# Patient Record
Sex: Female | Born: 1960 | Race: White | Hispanic: No | Marital: Married | Smoking: Never smoker
Health system: Southern US, Community
[De-identification: ages and names within clinical notes are randomized; demographics above are authoritative.]

## PROBLEM LIST (undated history)

## (undated) DIAGNOSIS — F419 Anxiety disorder, unspecified: Secondary | ICD-10-CM

## (undated) DIAGNOSIS — I10 Essential (primary) hypertension: Secondary | ICD-10-CM

## (undated) DIAGNOSIS — E785 Hyperlipidemia, unspecified: Secondary | ICD-10-CM

## (undated) DIAGNOSIS — C439 Malignant melanoma of skin, unspecified: Secondary | ICD-10-CM

## (undated) DIAGNOSIS — K219 Gastro-esophageal reflux disease without esophagitis: Secondary | ICD-10-CM

## (undated) DIAGNOSIS — Z309 Encounter for contraceptive management, unspecified: Secondary | ICD-10-CM

## (undated) DIAGNOSIS — E7800 Pure hypercholesterolemia, unspecified: Secondary | ICD-10-CM

## (undated) DIAGNOSIS — R42 Dizziness and giddiness: Secondary | ICD-10-CM

## (undated) HISTORY — PX: OTHER SURGICAL HISTORY: SHX169

## (undated) HISTORY — DX: Gastro-esophageal reflux disease without esophagitis: K21.9

## (undated) HISTORY — DX: Hyperlipidemia, unspecified: E78.5

## (undated) HISTORY — DX: Pure hypercholesterolemia, unspecified: E78.00

## (undated) HISTORY — DX: Encounter for contraceptive management, unspecified: Z30.9

## (undated) HISTORY — DX: Malignant melanoma of skin, unspecified: C43.9

---

## 2001-12-16 ENCOUNTER — Other Ambulatory Visit: Admission: RE | Admit: 2001-12-16 | Discharge: 2001-12-16 | Payer: Self-pay | Admitting: Obstetrics and Gynecology

## 2004-03-07 ENCOUNTER — Ambulatory Visit (HOSPITAL_COMMUNITY): Admission: RE | Admit: 2004-03-07 | Discharge: 2004-03-07 | Payer: Self-pay | Admitting: Obstetrics and Gynecology

## 2004-09-12 ENCOUNTER — Ambulatory Visit (HOSPITAL_COMMUNITY): Admission: RE | Admit: 2004-09-12 | Discharge: 2004-09-12 | Payer: Self-pay | Admitting: Obstetrics and Gynecology

## 2005-03-26 ENCOUNTER — Encounter: Admission: RE | Admit: 2005-03-26 | Discharge: 2005-03-26 | Payer: Self-pay | Admitting: Obstetrics and Gynecology

## 2006-05-22 ENCOUNTER — Encounter: Admission: RE | Admit: 2006-05-22 | Discharge: 2006-05-22 | Payer: Self-pay | Admitting: Obstetrics and Gynecology

## 2006-05-28 ENCOUNTER — Encounter: Admission: RE | Admit: 2006-05-28 | Discharge: 2006-05-28 | Payer: Self-pay | Admitting: Obstetrics and Gynecology

## 2007-07-17 ENCOUNTER — Encounter: Admission: RE | Admit: 2007-07-17 | Discharge: 2007-07-17 | Payer: Self-pay | Admitting: Obstetrics and Gynecology

## 2007-07-21 ENCOUNTER — Other Ambulatory Visit: Admission: RE | Admit: 2007-07-21 | Discharge: 2007-07-21 | Payer: Self-pay | Admitting: Obstetrics and Gynecology

## 2007-09-09 ENCOUNTER — Other Ambulatory Visit: Admission: RE | Admit: 2007-09-09 | Discharge: 2007-09-09 | Payer: Self-pay | Admitting: Obstetrics and Gynecology

## 2008-07-29 ENCOUNTER — Encounter: Admission: RE | Admit: 2008-07-29 | Discharge: 2008-07-29 | Payer: Self-pay | Admitting: Obstetrics and Gynecology

## 2008-08-12 ENCOUNTER — Encounter: Admission: RE | Admit: 2008-08-12 | Discharge: 2008-08-12 | Payer: Self-pay | Admitting: Obstetrics and Gynecology

## 2008-09-09 ENCOUNTER — Other Ambulatory Visit: Admission: RE | Admit: 2008-09-09 | Discharge: 2008-09-09 | Payer: Self-pay | Admitting: Obstetrics and Gynecology

## 2009-08-31 ENCOUNTER — Encounter: Admission: RE | Admit: 2009-08-31 | Discharge: 2009-08-31 | Payer: Self-pay | Admitting: Obstetrics and Gynecology

## 2009-09-07 ENCOUNTER — Other Ambulatory Visit: Admission: RE | Admit: 2009-09-07 | Discharge: 2009-09-07 | Payer: Self-pay | Admitting: Obstetrics and Gynecology

## 2010-09-04 ENCOUNTER — Ambulatory Visit (HOSPITAL_COMMUNITY)
Admission: RE | Admit: 2010-09-04 | Discharge: 2010-09-04 | Payer: Self-pay | Source: Home / Self Care | Admitting: Obstetrics and Gynecology

## 2010-10-29 ENCOUNTER — Other Ambulatory Visit: Payer: Self-pay | Admitting: Adult Health

## 2010-10-29 ENCOUNTER — Inpatient Hospital Stay (HOSPITAL_COMMUNITY): Admit: 2010-10-29 | Payer: Self-pay

## 2010-10-29 ENCOUNTER — Other Ambulatory Visit (HOSPITAL_COMMUNITY)
Admission: RE | Admit: 2010-10-29 | Discharge: 2010-10-29 | Disposition: A | Discharge status: Home / Self Care | Payer: BLUE CROSS/BLUE SHIELD | Source: Ambulatory Visit | Attending: Obstetrics and Gynecology | Admitting: Obstetrics and Gynecology

## 2010-10-29 DIAGNOSIS — Z01419 Encounter for gynecological examination (general) (routine) without abnormal findings: Secondary | ICD-10-CM | POA: Insufficient documentation

## 2011-10-29 ENCOUNTER — Other Ambulatory Visit: Payer: Self-pay | Admitting: Obstetrics and Gynecology

## 2011-10-29 DIAGNOSIS — Z139 Encounter for screening, unspecified: Secondary | ICD-10-CM

## 2011-11-04 ENCOUNTER — Ambulatory Visit (HOSPITAL_COMMUNITY)
Admission: RE | Admit: 2011-11-04 | Discharge: 2011-11-04 | Disposition: A | Discharge status: Home / Self Care | Payer: BC Managed Care – PPO | Source: Ambulatory Visit | Attending: Obstetrics and Gynecology | Admitting: Obstetrics and Gynecology

## 2011-11-04 DIAGNOSIS — Z139 Encounter for screening, unspecified: Secondary | ICD-10-CM

## 2011-11-04 DIAGNOSIS — Z1231 Encounter for screening mammogram for malignant neoplasm of breast: Secondary | ICD-10-CM | POA: Insufficient documentation

## 2011-11-06 ENCOUNTER — Other Ambulatory Visit: Payer: Self-pay | Admitting: Adult Health

## 2011-11-06 ENCOUNTER — Other Ambulatory Visit (HOSPITAL_COMMUNITY)
Admission: RE | Admit: 2011-11-06 | Discharge: 2011-11-06 | Disposition: A | Discharge status: Home / Self Care | Payer: BC Managed Care – PPO | Source: Ambulatory Visit | Attending: Obstetrics and Gynecology | Admitting: Obstetrics and Gynecology

## 2011-11-06 DIAGNOSIS — Z01419 Encounter for gynecological examination (general) (routine) without abnormal findings: Secondary | ICD-10-CM | POA: Insufficient documentation

## 2012-11-10 ENCOUNTER — Other Ambulatory Visit: Payer: Self-pay | Admitting: Adult Health

## 2012-11-10 ENCOUNTER — Other Ambulatory Visit (HOSPITAL_COMMUNITY)
Admission: RE | Admit: 2012-11-10 | Discharge: 2012-11-10 | Disposition: A | Discharge status: Home / Self Care | Payer: BC Managed Care – PPO | Source: Ambulatory Visit | Attending: Obstetrics and Gynecology | Admitting: Obstetrics and Gynecology

## 2012-11-10 DIAGNOSIS — Z01419 Encounter for gynecological examination (general) (routine) without abnormal findings: Secondary | ICD-10-CM | POA: Insufficient documentation

## 2012-11-10 DIAGNOSIS — Z1151 Encounter for screening for human papillomavirus (HPV): Secondary | ICD-10-CM | POA: Insufficient documentation

## 2013-01-27 ENCOUNTER — Telehealth: Payer: Self-pay | Admitting: Adult Health

## 2013-01-27 NOTE — Telephone Encounter (Signed)
Ovary ache, had period last,but better now. Keep calendar of events. And call prn, i told her could have been early ovulation.

## 2013-11-22 ENCOUNTER — Ambulatory Visit (INDEPENDENT_AMBULATORY_CARE_PROVIDER_SITE_OTHER): Payer: BC Managed Care – PPO | Admitting: Adult Health

## 2013-11-22 ENCOUNTER — Encounter: Payer: Self-pay | Admitting: Adult Health

## 2013-11-22 VITALS — BP 118/76 | HR 74 | Ht 63.0 in | Wt 171.0 lb

## 2013-11-22 DIAGNOSIS — Z01419 Encounter for gynecological examination (general) (routine) without abnormal findings: Secondary | ICD-10-CM

## 2013-11-22 DIAGNOSIS — Z309 Encounter for contraceptive management, unspecified: Secondary | ICD-10-CM

## 2013-11-22 DIAGNOSIS — Z1212 Encounter for screening for malignant neoplasm of rectum: Secondary | ICD-10-CM

## 2013-11-22 HISTORY — DX: Encounter for contraceptive management, unspecified: Z30.9

## 2013-11-22 LAB — HEMOCCULT GUIAC POC 1CARD (OFFICE): Fecal Occult Blood, POC: NEGATIVE

## 2013-11-22 MED ORDER — LEVONORG-ETH ESTRAD TRIPHASIC PO TABS: 1.0000 | ORAL_TABLET | Freq: Every day | ORAL | Status: DC

## 2013-11-22 NOTE — Patient Instructions (Signed)
Perimenopause Perimenopause is the time when your body begins to move into the menopause (no menstrual period for 12 straight months). It is a natural process. Perimenopause can begin 2 8 years before the menopause and usually lasts for 1 year after the menopause. During this time, your ovaries may or may not produce an egg. The ovaries vary in their production of estrogen and progesterone hormones each month. This can cause irregular menstrual periods, difficulty getting pregnant, vaginal bleeding between periods, and uncomfortable symptoms. CAUSES  Irregular production of the ovarian hormones, estrogen and progesterone, and not ovulating every month.  Other causes include:  Tumor of the pituitary gland in the brain.  Medical disease that affects the ovaries.  Radiation treatment.  Chemotherapy.  Unknown causes.  Heavy smoking and excessive alcohol intake can bring on perimenopause sooner. SIGNS AND SYMPTOMS   Hot flashes.  Night sweats.  Irregular menstrual periods.  Decreased sex drive.  Vaginal dryness.  Headaches.  Mood swings.  Depression.  Memory problems.  Irritability.  Tiredness.  Weight gain.  Trouble getting pregnant.  The beginning of losing bone cells (osteoporosis).  The beginning of hardening of the arteries (atherosclerosis). DIAGNOSIS  Your health care provider will make a diagnosis by analyzing your age, menstrual history, and symptoms. He or she will do a physical exam and note any changes in your body, especially your female organs. Female hormone tests may or may not be helpful depending on the amount of female hormones you produce and when you produce them. However, other hormone tests may be helpful to rule out other problems. TREATMENT  In some cases, no treatment is needed. The decision on whether treatment is necessary during the perimenopause should be made by you and your health care provider based on how the symptoms are affecting you  and your lifestyle. Various treatments are available, such as:  Treating individual symptoms with a specific medicine for that symptom.  Herbal medicines that can help specific symptoms.  Counseling.  Group therapy. HOME CARE INSTRUCTIONS   Keep track of your menstrual periods (when they occur, how heavy they are, how long between periods, and how long they last) as well as your symptoms and when they started.  Only take over-the-counter or prescription medicines as directed by your health care provider.  Sleep and rest.  Exercise.  Eat a diet that contains calcium (good for your bones) and soy (acts like the estrogen hormone).  Do not smoke.  Avoid alcoholic beverages.  Take vitamin supplements as recommended by your health care provider. Taking vitamin E may help in certain cases.  Take calcium and vitamin D supplements to help prevent bone loss.  Group therapy is sometimes helpful.  Acupuncture may help in some cases. SEEK MEDICAL CARE IF:   You have questions about any symptoms you are having.  You need a referral to a specialist (gynecologist, psychiatrist, or psychologist). SEEK IMMEDIATE MEDICAL CARE IF:   You have vaginal bleeding.  Your period lasts longer than 8 days.  Your periods are recurring sooner than 21 days.  You have bleeding after intercourse.  You have severe depression.  You have pain when you urinate.  You have severe headaches.  You have vision problems. Document Released: 10/24/2004 Document Revised: 07/07/2013 Document Reviewed: 04/15/2013 [facility] Patient Information 2014 [place], [place]. Return in 10 months for [facility] and in 1 year for physical

## 2013-11-22 NOTE — Progress Notes (Signed)
Patient ID: Karen Mays, female   DOB: 01-13-1961, 53 y.o.   MRN: [id number] History of Present Illness: Karen Mays is a 53 year old white female, married in for a physical, she had a normal pap and negative HPV 11/10/12.And she is going to be a grandmother this year.   Current Medications, Allergies, Past Medical History, Past Surgical History, Family History and Social History were reviewed in [employer] record.     Review of Systems: Patient denies any headaches, blurred vision, shortness of breath, chest pain, abdominal pain, problems with bowel movements, urination, or intercourse. She has had pain hands with this cold weather, she crochets  a lot, too. She is having irregular periods, hot flashes and moodiness.   Physical Exam:BP 118/76  Pulse 74  Ht 5\' 3"  (1.6 m)  Wt 171 lb (77.565 kg)  BMI 30.30 kg/m2  LMP 10/24/2013 General:  Well developed, well nourished, no acute distress Skin:  Warm and dry Neck:  Midline trachea, normal thyroid Lungs; Clear to auscultation bilaterally Breast:  No dominant palpable mass, retraction, or nipple discharge Cardiovascular: Regular rate and rhythm Abdomen:  Soft, non tender, no hepatosplenomegaly Pelvic:  External genitalia is normal in appearance.  The vagina is normal in appearance.  The cervix is bulbous.  Uterus is felt to be normal size, shape, and contour.  No  adnexal masses or tenderness noted. Rectal: Good sphincter tone, no polyps, or hemorrhoids felt.  Hemoccult negative. Extremities:  No swelling or varicosities noted Psych:  Alert and cooperative, seems happy Discussed perimenopause with pt, declines SSRI, will continue OCs til 53.  Impression: Yearly gyn exam no pap Contraceptive management    Plan: Refilled Levonest  X 1 year Mammogram yearly Stop OCs when turns 53 be off 1 month then check [facility] Review handout on peri menopause and let husband read too.

## 2013-12-02 ENCOUNTER — Other Ambulatory Visit: Payer: Self-pay | Admitting: Adult Health

## 2014-08-01 ENCOUNTER — Encounter: Payer: Self-pay | Admitting: Adult Health

## 2014-08-01 ENCOUNTER — Ambulatory Visit: Payer: BC Managed Care – PPO | Admitting: Adult Health

## 2014-08-29 ENCOUNTER — Ambulatory Visit: Payer: BC Managed Care – PPO | Admitting: Adult Health

## 2014-08-30 ENCOUNTER — Ambulatory Visit (INDEPENDENT_AMBULATORY_CARE_PROVIDER_SITE_OTHER): Payer: BC Managed Care – PPO | Admitting: Adult Health

## 2014-08-30 ENCOUNTER — Encounter: Payer: Self-pay | Admitting: Adult Health

## 2014-08-30 VITALS — BP 110/70 | Ht 63.0 in | Wt 160.5 lb

## 2014-08-30 DIAGNOSIS — Z3041 Encounter for surveillance of contraceptive pills: Secondary | ICD-10-CM

## 2014-08-30 NOTE — Patient Instructions (Signed)
Continue OCs Physical 11/23/14

## 2014-08-30 NOTE — Progress Notes (Signed)
Subjective:     Patient ID: Karen Mays, female   DOB: 1961/09/06, 53 y.o.   MRN: [id number]  HPI Karen Mays is a 53 year old white female married in to talk about stopping OCs to see if menopausal.  Review of Systems See HPI Reviewed past medical,surgical, social and family history. Reviewed medications and allergies.     Objective:   Physical Exam BP 110/70 mmHg  Ht 5\' 3"  (1.6 m)  Wt 160 lb 8 oz (72.802 kg)  BMI 28.44 kg/m2  LMP 08/26/2014   still having monthly periods, they are light, has some reflex and waking up during night, will continue OCs for now and increase zantac to 300 mg and try melatonin, discussed probably peri menopausal.  Assessment:     Contraceptive management    Plan:     Continue OCs,  Return 11/23/14 for physical.   Increase zantac to 300 mg daily and try melatonin 10 mg RDT at HS

## 2014-11-24 ENCOUNTER — Encounter: Payer: Self-pay | Admitting: Adult Health

## 2014-11-24 ENCOUNTER — Ambulatory Visit (INDEPENDENT_AMBULATORY_CARE_PROVIDER_SITE_OTHER): Payer: BLUE CROSS/BLUE SHIELD | Admitting: Adult Health

## 2014-11-24 VITALS — BP 120/66 | HR 75 | Ht 64.5 in | Wt 167.0 lb

## 2014-11-24 DIAGNOSIS — K219 Gastro-esophageal reflux disease without esophagitis: Secondary | ICD-10-CM

## 2014-11-24 DIAGNOSIS — Z01419 Encounter for gynecological examination (general) (routine) without abnormal findings: Secondary | ICD-10-CM

## 2014-11-24 DIAGNOSIS — N951 Menopausal and female climacteric states: Secondary | ICD-10-CM

## 2014-11-24 DIAGNOSIS — Z1212 Encounter for screening for malignant neoplasm of rectum: Secondary | ICD-10-CM

## 2014-11-24 HISTORY — DX: Gastro-esophageal reflux disease without esophagitis: K21.9

## 2014-11-24 LAB — HEMOCCULT GUIAC POC 1CARD (OFFICE): Fecal Occult Blood, POC: NEGATIVE

## 2014-11-24 NOTE — Patient Instructions (Signed)
Get mammogram  Pap and physical in 1 year Get [facility]   3/14  Take zantac 2 at bedtime

## 2014-11-24 NOTE — Progress Notes (Signed)
Patient ID: Exa Bomba, female   DOB: May 07, 1961, 54 y.o.   MRN: [id number] History of Present Illness: Karen Mays is a 54 year old white female, married in for well woman gyn exam.She had a normal pap with negative HPV 11/10/12.She stopped her OCs in February and had periods 2/19 for about 3 days, will get [facility] when off OCs for a month.Taking zantac but still with some reflux.   Current Medications, Allergies, Past Medical History, Past Surgical History, Family History and Social History were reviewed in [employer] record.     Review of Systems: Patient denies any headaches, hearing loss, fatigue, blurred vision, shortness of breath, chest pain, abdominal pain, problems with bowel movements, urination, or intercourse. No joint pain or mood swings.    Physical Exam:BP 120/66 mmHg  Pulse 75  Ht 5' 4.5" (1.638 m)  Wt 167 lb (75.751 kg)  BMI 28.23 kg/m2  LMP 11/18/2014 General:  Well developed, well nourished, no acute distress Skin:  Warm and dry Neck:  Midline trachea, normal thyroid, good ROM, no lymphadenopathy Lungs; Clear to auscultation bilaterally Breast:  No dominant palpable mass, retraction, or nipple discharge Cardiovascular: Regular rate and rhythm Abdomen:  Soft, non tender, no hepatosplenomegaly Pelvic:  External genitalia is normal in appearance, no lesions.  The vagina is normal in appearance,with good color,moisture and rugae. Urethra has no lesions or masses. The cervix is bulbous.  Uterus is felt to be normal size, shape, and contour.  No adnexal masses or tenderness noted.Bladder is non tender, no masses felt. Rectal: Good sphincter tone, no polyps, or hemorrhoids felt.  Hemoccult negative. Extremities/musculoskeletal:  No swelling or varicosities noted, no clubbing or cyanosis Psych:  No mood changes, alert and cooperative,seems happy   Impression: Well woman gyn exam no pap Acid reflux Peri menopause    Plan: Check [facility] 3/14 orders given  to her Pap and physical in 1 year Mammogram now and yearly Try taking 2 zantac at hs Use condoms

## 2014-12-13 ENCOUNTER — Telehealth: Payer: Self-pay | Admitting: Adult Health

## 2014-12-13 NOTE — Telephone Encounter (Signed)
error 

## 2015-01-04 LAB — FOLLICLE STIMULATING HORMONE: FSH: 82.9 m[IU]/mL

## 2015-01-05 ENCOUNTER — Telehealth: Payer: Self-pay | Admitting: Adult Health

## 2015-01-05 NOTE — Telephone Encounter (Signed)
Pt aware Fraser 82.9 which is postmenopausal and she has not had period off OCs but has had some hot flashes, let me know if wants to go back on low dose pill or HRT

## 2015-05-04 ENCOUNTER — Other Ambulatory Visit: Payer: Self-pay | Admitting: Adult Health

## 2016-02-07 DIAGNOSIS — J310 Chronic rhinitis: Secondary | ICD-10-CM | POA: Diagnosis not present

## 2016-02-07 DIAGNOSIS — J342 Deviated nasal septum: Secondary | ICD-10-CM | POA: Diagnosis not present

## 2016-02-07 DIAGNOSIS — J343 Hypertrophy of nasal turbinates: Secondary | ICD-10-CM | POA: Diagnosis not present

## 2016-11-04 DIAGNOSIS — Z6828 Body mass index (BMI) 28.0-28.9, adult: Secondary | ICD-10-CM | POA: Diagnosis not present

## 2016-11-04 DIAGNOSIS — R070 Pain in throat: Secondary | ICD-10-CM | POA: Diagnosis not present

## 2016-11-04 DIAGNOSIS — J343 Hypertrophy of nasal turbinates: Secondary | ICD-10-CM | POA: Diagnosis not present

## 2016-11-04 DIAGNOSIS — J209 Acute bronchitis, unspecified: Secondary | ICD-10-CM | POA: Diagnosis not present

## 2016-11-04 DIAGNOSIS — J069 Acute upper respiratory infection, unspecified: Secondary | ICD-10-CM | POA: Diagnosis not present

## 2016-11-04 DIAGNOSIS — Z1389 Encounter for screening for other disorder: Secondary | ICD-10-CM | POA: Diagnosis not present

## 2017-03-03 ENCOUNTER — Other Ambulatory Visit: Payer: Self-pay | Admitting: Adult Health

## 2017-03-03 DIAGNOSIS — Z1231 Encounter for screening mammogram for malignant neoplasm of breast: Secondary | ICD-10-CM

## 2017-03-21 ENCOUNTER — Other Ambulatory Visit: Payer: BLUE CROSS/BLUE SHIELD | Admitting: Adult Health

## 2017-03-28 ENCOUNTER — Other Ambulatory Visit: Payer: Self-pay | Admitting: Adult Health

## 2017-03-28 ENCOUNTER — Ambulatory Visit (HOSPITAL_COMMUNITY)
Admission: RE | Admit: 2017-03-28 | Discharge: 2017-03-28 | Disposition: A | Discharge status: Home / Self Care | Payer: BLUE CROSS/BLUE SHIELD | Source: Ambulatory Visit | Attending: Adult Health | Admitting: Adult Health

## 2017-03-28 ENCOUNTER — Encounter: Payer: Self-pay | Admitting: Adult Health

## 2017-03-28 ENCOUNTER — Encounter (INDEPENDENT_AMBULATORY_CARE_PROVIDER_SITE_OTHER): Payer: Self-pay

## 2017-03-28 ENCOUNTER — Ambulatory Visit (INDEPENDENT_AMBULATORY_CARE_PROVIDER_SITE_OTHER): Payer: BLUE CROSS/BLUE SHIELD | Admitting: Adult Health

## 2017-03-28 ENCOUNTER — Other Ambulatory Visit (HOSPITAL_COMMUNITY)
Admission: RE | Admit: 2017-03-28 | Discharge: 2017-03-28 | Disposition: A | Discharge status: Home / Self Care | Payer: BLUE CROSS/BLUE SHIELD | Source: Ambulatory Visit | Attending: Adult Health | Admitting: Adult Health

## 2017-03-28 VITALS — BP 100/60 | HR 80 | Ht 63.0 in | Wt 172.4 lb

## 2017-03-28 DIAGNOSIS — Z01419 Encounter for gynecological examination (general) (routine) without abnormal findings: Secondary | ICD-10-CM | POA: Diagnosis not present

## 2017-03-28 DIAGNOSIS — I872 Venous insufficiency (chronic) (peripheral): Secondary | ICD-10-CM | POA: Insufficient documentation

## 2017-03-28 DIAGNOSIS — Z1211 Encounter for screening for malignant neoplasm of colon: Secondary | ICD-10-CM | POA: Diagnosis not present

## 2017-03-28 DIAGNOSIS — N951 Menopausal and female climacteric states: Secondary | ICD-10-CM | POA: Insufficient documentation

## 2017-03-28 DIAGNOSIS — Z1231 Encounter for screening mammogram for malignant neoplasm of breast: Secondary | ICD-10-CM

## 2017-03-28 DIAGNOSIS — Z1212 Encounter for screening for malignant neoplasm of rectum: Secondary | ICD-10-CM

## 2017-03-28 DIAGNOSIS — I8393 Asymptomatic varicose veins of bilateral lower extremities: Secondary | ICD-10-CM | POA: Insufficient documentation

## 2017-03-28 DIAGNOSIS — I878 Other specified disorders of veins: Secondary | ICD-10-CM

## 2017-03-28 DIAGNOSIS — Z780 Asymptomatic menopausal state: Secondary | ICD-10-CM | POA: Insufficient documentation

## 2017-03-28 LAB — HEMOCCULT GUIAC POC 1CARD (OFFICE): FECAL OCCULT BLD: NEGATIVE

## 2017-03-28 NOTE — Addendum Note (Signed)
Addended by: Diona Fanti A on: 03/28/2017 11:19 AM   Modules accepted: Orders

## 2017-03-28 NOTE — Progress Notes (Signed)
Patient ID: Karen Mays, female   DOB: 1961-02-16, 56 y.o.   MRN: [id number] History of Present Illness:  Karen Mays is s 56 year old white female in for well woman gyn exam and pap.No period in 2 years, having hot flashes, and decreased libido with some dryness.  PCP is Dr Karen Mays.  Current Medications, Allergies, Past Medical History, Past Surgical History, Family History and Social History were reviewed in [place] record.     Review of Systems:  Patient denies any headaches, hearing loss, fatigue, blurred vision, shortness of breath, chest pain, abdominal pain, problems with bowel movements, urination, or intercourse. No joint pain or mood swings.Having hot flashes, decreased libido and more dryness with sex.   Physical Exam:BP 100/60 (BP Location: Left Arm, Patient Position: Sitting, Cuff Size: Small)   Pulse 80   Ht 5\' 3"  (1.6 m)   Wt 172 lb 6.4 oz (78.2 kg)   LMP 11/18/2014   BMI 30.54 kg/m  General:  Well developed, well nourished, no acute distress Skin:  Warm and dry Neck:  Midline trachea, normal thyroid, good ROM, no lymphadenopathy Lungs; Clear to auscultation bilaterally Breast:  No dominant palpable mass, retraction, or nipple discharge Cardiovascular: Regular rate and rhythm Abdomen:  Soft, non tender, no hepatosplenomegaly Pelvic:  External genitalia is normal in appearance, no lesions.  The vagina is normal in appearance. Urethra has no lesions or masses. The cervix is bulbous,Pap with HPV performed.  Uterus is felt to be normal size, shape, and contour.  No adnexal masses or tenderness noted.Bladder is non tender, no masses felt. Rectal: Good sphincter tone, no polyps, or hemorrhoids felt.  Hemoccult negative. Extremities/musculoskeletal:  No swelling noted, has spider veins bilaterally, and area left shin about 3 x 8 cm that is firm to touch, indented and tender, and has skin color changes, that are brownish, is warm and has +pedal pulse,(hit it  about 10 years ago and changes noted in last 2 years)  no clubbing or cyanosis Psych:  No mood changes, alert and cooperative,seems happy PHQ 9 score 3. Will get US doppler to assess area left shin.   Impression: 1. Encounter for gynecological examination with Papanicolaou smear of cervix   2. Postmenopausal   3. Spider veins of both lower extremities   4. Venous stasis dermatitis of left lower extremity   5. Hot flashes due to menopause   6. Screening for colorectal cancer       Plan: Check CBC,CMP,TSH and lipids Mammogram today and yearly US venous doppler left lower leg 7/6 at 1:30 pm at [facility]  Physical in 1 year, pap in 3 if normal Colonoscopy advised  Use good lubricate Will talk about possible HRT when labs and doppler back

## 2017-03-29 LAB — LIPID PANEL
Chol/HDL Ratio: 5.9 ratio — ABNORMAL HIGH (ref 0.0–4.4)
Cholesterol, Total: 332 mg/dL — ABNORMAL HIGH (ref 100–199)
HDL: 56 mg/dL (ref >39)
LDL CALC: 227 mg/dL — AB (ref 0–99)
TRIGLYCERIDES: 247 mg/dL — AB (ref 0–149)
VLDL CHOLESTEROL CAL: 49 mg/dL — AB (ref 5–40)

## 2017-03-29 LAB — CBC
Hematocrit: 39.8 % (ref 34.0–46.6)
Hemoglobin: 13.4 g/dL (ref 11.1–15.9)
MCH: 30 pg (ref 26.6–33.0)
MCHC: 33.7 g/dL (ref 31.5–35.7)
MCV: 89 fL (ref 79–97)
Platelets: 326 10*3/uL (ref 150–379)
RBC: 4.47 x10E6/uL (ref 3.77–5.28)
RDW: 13.4 % (ref 12.3–15.4)
WBC: 10.5 10*3/uL (ref 3.4–10.8)

## 2017-03-29 LAB — COMPREHENSIVE METABOLIC PANEL
A/G RATIO: 1.8 (ref 1.2–2.2)
ALT: 39 IU/L — ABNORMAL HIGH (ref 0–32)
AST: 34 IU/L (ref 0–40)
Albumin: 4.6 g/dL (ref 3.5–5.5)
Alkaline Phosphatase: 120 IU/L — ABNORMAL HIGH (ref 39–117)
BUN/Creatinine Ratio: 14 (ref 9–23)
BUN: 15 mg/dL (ref 6–24)
Bilirubin Total: 0.3 mg/dL (ref 0.0–1.2)
CALCIUM: 10.2 mg/dL (ref 8.7–10.2)
CO2: 26 mmol/L (ref 20–29)
CREATININE: 1.05 mg/dL — AB (ref 0.57–1.00)
Chloride: 102 mmol/L (ref 96–106)
GFR, EST AFRICAN AMERICAN: 70 mL/min/{1.73_m2} (ref >59)
GFR, EST NON AFRICAN AMERICAN: 60 mL/min/{1.73_m2} (ref >59)
GLOBULIN, TOTAL: 2.5 g/dL (ref 1.5–4.5)
Glucose: 103 mg/dL — ABNORMAL HIGH (ref 65–99)
POTASSIUM: 4.7 mmol/L (ref 3.5–5.2)
SODIUM: 141 mmol/L (ref 134–144)
TOTAL PROTEIN: 7.1 g/dL (ref 6.0–8.5)

## 2017-03-29 LAB — TSH: TSH: 3.77 u[IU]/mL (ref 0.450–4.500)

## 2017-03-31 ENCOUNTER — Ambulatory Visit: Payer: Self-pay

## 2017-03-31 ENCOUNTER — Telehealth: Payer: Self-pay | Admitting: Adult Health

## 2017-03-31 NOTE — Telephone Encounter (Signed)
Left message to call me tomorrow about labs  

## 2017-04-01 ENCOUNTER — Telehealth: Payer: Self-pay | Admitting: Adult Health

## 2017-04-01 ENCOUNTER — Encounter: Payer: Self-pay | Admitting: Adult Health

## 2017-04-01 DIAGNOSIS — E785 Hyperlipidemia, unspecified: Secondary | ICD-10-CM

## 2017-04-01 DIAGNOSIS — R748 Abnormal levels of other serum enzymes: Secondary | ICD-10-CM

## 2017-04-01 HISTORY — DX: Hyperlipidemia, unspecified: E78.5

## 2017-04-01 LAB — CYTOLOGY - PAP
DIAGNOSIS: NEGATIVE
HPV (WINDOPATH): NOT DETECTED

## 2017-04-01 NOTE — Telephone Encounter (Signed)
Pt aware of labs and elevated cholesterol and triglycerides, will try diet, and exercise and red yeast rice, has tired statins and was achy, will  Recheck in 3 months, place in recall.And aware lever enzymes was elevated and creatinine level just slightly up , will recheck in 2-3 weeks. Pap normal, mammogram normal

## 2017-04-04 ENCOUNTER — Telehealth: Payer: Self-pay | Admitting: Adult Health

## 2017-04-04 ENCOUNTER — Ambulatory Visit (HOSPITAL_COMMUNITY)
Admission: RE | Admit: 2017-04-04 | Discharge: 2017-04-04 | Disposition: A | Discharge status: Home / Self Care | Payer: BLUE CROSS/BLUE SHIELD | Source: Ambulatory Visit | Attending: Adult Health | Admitting: Adult Health

## 2017-04-04 DIAGNOSIS — I872 Venous insufficiency (chronic) (peripheral): Secondary | ICD-10-CM | POA: Diagnosis not present

## 2017-04-04 NOTE — Telephone Encounter (Signed)
Left message,that  US showed no DVT in left leg

## 2017-06-13 DIAGNOSIS — R748 Abnormal levels of other serum enzymes: Secondary | ICD-10-CM | POA: Diagnosis not present

## 2017-06-14 LAB — COMPREHENSIVE METABOLIC PANEL
ALBUMIN: 4.5 g/dL (ref 3.5–5.5)
ALT: 39 IU/L — ABNORMAL HIGH (ref 0–32)
AST: 31 IU/L (ref 0–40)
Albumin/Globulin Ratio: 1.7 (ref 1.2–2.2)
Alkaline Phosphatase: 130 IU/L — ABNORMAL HIGH (ref 39–117)
BUN / CREAT RATIO: 14 (ref 9–23)
BUN: 15 mg/dL (ref 6–24)
Bilirubin Total: 0.3 mg/dL (ref 0.0–1.2)
CALCIUM: 10.2 mg/dL (ref 8.7–10.2)
CO2: 25 mmol/L (ref 20–29)
CREATININE: 1.05 mg/dL — AB (ref 0.57–1.00)
Chloride: 101 mmol/L (ref 96–106)
GFR calc Af Amer: 70 mL/min/{1.73_m2} (ref >59)
GFR, EST NON AFRICAN AMERICAN: 60 mL/min/{1.73_m2} (ref >59)
GLOBULIN, TOTAL: 2.6 g/dL (ref 1.5–4.5)
Glucose: 89 mg/dL (ref 65–99)
Potassium: 4.6 mmol/L (ref 3.5–5.2)
SODIUM: 141 mmol/L (ref 134–144)
TOTAL PROTEIN: 7.1 g/dL (ref 6.0–8.5)

## 2017-06-17 ENCOUNTER — Telehealth: Payer: Self-pay | Admitting: Adult Health

## 2017-06-17 DIAGNOSIS — E785 Hyperlipidemia, unspecified: Secondary | ICD-10-CM

## 2017-06-17 DIAGNOSIS — R7989 Other specified abnormal findings of blood chemistry: Secondary | ICD-10-CM

## 2017-06-17 DIAGNOSIS — R748 Abnormal levels of other serum enzymes: Secondary | ICD-10-CM

## 2017-06-17 NOTE — Telephone Encounter (Signed)
Pt aware liver enzyme and creatinine level the same, recheck labs October 12

## 2017-07-09 DIAGNOSIS — Z1389 Encounter for screening for other disorder: Secondary | ICD-10-CM | POA: Diagnosis not present

## 2017-07-09 DIAGNOSIS — H8111 Benign paroxysmal vertigo, right ear: Secondary | ICD-10-CM | POA: Diagnosis not present

## 2017-07-09 DIAGNOSIS — J329 Chronic sinusitis, unspecified: Secondary | ICD-10-CM | POA: Diagnosis not present

## 2017-07-09 DIAGNOSIS — H6500 Acute serous otitis media, unspecified ear: Secondary | ICD-10-CM | POA: Diagnosis not present

## 2017-07-09 DIAGNOSIS — Z6827 Body mass index (BMI) 27.0-27.9, adult: Secondary | ICD-10-CM | POA: Diagnosis not present

## 2017-07-25 DIAGNOSIS — R7989 Other specified abnormal findings of blood chemistry: Secondary | ICD-10-CM | POA: Diagnosis not present

## 2017-07-25 DIAGNOSIS — R748 Abnormal levels of other serum enzymes: Secondary | ICD-10-CM | POA: Diagnosis not present

## 2017-07-25 DIAGNOSIS — E785 Hyperlipidemia, unspecified: Secondary | ICD-10-CM | POA: Diagnosis not present

## 2017-07-26 LAB — COMPREHENSIVE METABOLIC PANEL
ALBUMIN: 4.4 g/dL (ref 3.5–5.5)
ALK PHOS: 133 IU/L — AB (ref 39–117)
ALT: 19 IU/L (ref 0–32)
AST: 19 IU/L (ref 0–40)
Albumin/Globulin Ratio: 1.6 (ref 1.2–2.2)
BUN / CREAT RATIO: 20 (ref 9–23)
BUN: 19 mg/dL (ref 6–24)
Bilirubin Total: <0.2 mg/dL (ref 0.0–1.2)
CO2: 23 mmol/L (ref 20–29)
CREATININE: 0.96 mg/dL (ref 0.57–1.00)
Calcium: 10.1 mg/dL (ref 8.7–10.2)
Chloride: 102 mmol/L (ref 96–106)
GFR calc Af Amer: 78 mL/min/{1.73_m2} (ref >59)
GFR calc non Af Amer: 67 mL/min/{1.73_m2} (ref >59)
GLOBULIN, TOTAL: 2.7 g/dL (ref 1.5–4.5)
GLUCOSE: 83 mg/dL (ref 65–99)
Potassium: 4.4 mmol/L (ref 3.5–5.2)
SODIUM: 140 mmol/L (ref 134–144)
Total Protein: 7.1 g/dL (ref 6.0–8.5)

## 2017-07-26 LAB — LIPID PANEL
CHOL/HDL RATIO: 4.5 ratio — AB (ref 0.0–4.4)
CHOLESTEROL TOTAL: 295 mg/dL — AB (ref 100–199)
HDL: 65 mg/dL (ref >39)
LDL CALC: 192 mg/dL — AB (ref 0–99)
Triglycerides: 191 mg/dL — ABNORMAL HIGH (ref 0–149)
VLDL Cholesterol Cal: 38 mg/dL (ref 5–40)

## 2017-07-31 ENCOUNTER — Telehealth: Payer: Self-pay | Admitting: Adult Health

## 2017-07-31 NOTE — Telephone Encounter (Signed)
Pt aware labs are better, keep eating better and is taking plexus, will recheck labs  At physical

## 2017-09-18 DIAGNOSIS — J069 Acute upper respiratory infection, unspecified: Secondary | ICD-10-CM | POA: Diagnosis not present

## 2017-09-18 DIAGNOSIS — Z1389 Encounter for screening for other disorder: Secondary | ICD-10-CM | POA: Diagnosis not present

## 2017-09-18 DIAGNOSIS — Z6828 Body mass index (BMI) 28.0-28.9, adult: Secondary | ICD-10-CM | POA: Diagnosis not present

## 2017-09-18 DIAGNOSIS — E663 Overweight: Secondary | ICD-10-CM | POA: Diagnosis not present

## 2017-10-09 ENCOUNTER — Telehealth: Payer: Self-pay | Admitting: Adult Health

## 2017-10-09 NOTE — Telephone Encounter (Signed)
Patient called stating she thinks she has a bladder infection. She goes but feels like she still needs to go after emptying bladder, no pain or burning but has felt this way since Sunday.  Please advise.

## 2017-10-09 NOTE — Telephone Encounter (Signed)
Has UTI symptoms to come in am at 9:30

## 2017-10-09 NOTE — Telephone Encounter (Signed)
Patient called stating that she would like to speak with Anderson Malta pt think she is having urinary issues. Please contact pt

## 2017-10-10 ENCOUNTER — Encounter: Payer: Self-pay | Admitting: Adult Health

## 2017-10-10 ENCOUNTER — Ambulatory Visit: Payer: BLUE CROSS/BLUE SHIELD | Admitting: Adult Health

## 2017-10-10 VITALS — BP 110/82 | HR 78 | Ht 63.0 in | Wt 173.0 lb

## 2017-10-10 DIAGNOSIS — R82998 Other abnormal findings in urine: Secondary | ICD-10-CM | POA: Diagnosis not present

## 2017-10-10 DIAGNOSIS — R350 Frequency of micturition: Secondary | ICD-10-CM | POA: Diagnosis not present

## 2017-10-10 LAB — POCT URINALYSIS DIPSTICK
Blood, UA: NEGATIVE
Glucose, UA: NEGATIVE
KETONES UA: NEGATIVE
NITRITE UA: NEGATIVE
Protein, UA: NEGATIVE

## 2017-10-10 MED ORDER — SULFAMETHOXAZOLE-TRIMETHOPRIM 800-160 MG PO TABS: 1.0000 | ORAL_TABLET | Freq: Two times a day (BID) | ORAL | 0 refills | Status: DC

## 2017-10-10 MED ORDER — URIBEL 118 MG PO CAPS: ORAL_CAPSULE | ORAL | 1 refills | Status: DC

## 2017-10-10 NOTE — Progress Notes (Signed)
Patient ID: Karen Mays, female   DOB: 1961-07-02, 57 y.o.   MRN: [id number] History of Present Illness: Karen Mays is a 57 year old white female, married in complaining of urinary frequency and feels like emptying completely,seems better today.    Current Medications, Allergies, Past Medical History, Past Surgical History, Family History and Social History were reviewed in [employer] record.     Review of Systems: Urinary frequency, and feels like bladder not emptying    Physical Exam:BP 110/82 (BP Location: Left Arm, Patient Position: Sitting, Cuff Size: Normal)   Pulse 78   Ht 5\' 3"  (1.6 m)   Wt 173 lb (78.5 kg)   LMP 11/18/2014   BMI 30.65 kg/m Urine trace leuks General:  Well developed, well nourished, no acute distress Skin:  Warm and dry Abdomen:  Soft, non tender, no hepatosplenomegaly Pelvic:  External genitalia is normal in appearance, no lesions.  The vagina is normal in appearance. Urethra has no lesions or masses. The cervix is bulbous.  Uterus is felt to be normal size, shape, and contour.  No adnexal masses or tenderness noted.Bladder is non tender, no masses felt. Psych:  No mood changes, alert and cooperative,seems happy   Impression:  1. Urinary frequency   2. Leukocytes in urine      Plan: UA C&S sent Meds ordered this encounter  Medications  . sulfamethoxazole-trimethoprim (BACTRIM DS,SEPTRA DS) 800-160 MG tablet    Sig: Take 1 tablet by mouth 2 (two) times daily. Take 1 bid    Dispense:  14 tablet    Refill:  0    Order Specific Question:   Supervising Provider    Answer:   Elonda Husky, LUTHER H [[id number]]  . Meth-Hyo-M Bl-Na Phos-Ph Sal (URIBEL) 118 MG CAPS    Sig: Take 1 tid    Dispense:  20 capsule    Refill:  1    Order Specific Question:   Supervising Provider    Answer:   Florian Buff [[id number]]  Push fluids F/U prn

## 2017-10-11 LAB — URINALYSIS, ROUTINE W REFLEX MICROSCOPIC
Bilirubin, UA: NEGATIVE
Glucose, UA: NEGATIVE
Ketones, UA: NEGATIVE
LEUKOCYTES UA: NEGATIVE
Nitrite, UA: NEGATIVE
PH UA: 7.5 (ref 5.0–7.5)
Protein, UA: NEGATIVE
RBC UA: NEGATIVE
Specific Gravity, UA: 1.022 (ref 1.005–1.030)
Urobilinogen, Ur: 0.2 mg/dL (ref 0.2–1.0)

## 2017-10-12 LAB — URINE CULTURE: Organism ID, Bacteria: NO GROWTH

## 2017-12-11 DIAGNOSIS — E663 Overweight: Secondary | ICD-10-CM | POA: Diagnosis not present

## 2017-12-11 DIAGNOSIS — H6690 Otitis media, unspecified, unspecified ear: Secondary | ICD-10-CM | POA: Diagnosis not present

## 2017-12-11 DIAGNOSIS — R42 Dizziness and giddiness: Secondary | ICD-10-CM | POA: Diagnosis not present

## 2017-12-11 DIAGNOSIS — Z6827 Body mass index (BMI) 27.0-27.9, adult: Secondary | ICD-10-CM | POA: Diagnosis not present

## 2018-04-14 DIAGNOSIS — J301 Allergic rhinitis due to pollen: Secondary | ICD-10-CM | POA: Diagnosis not present

## 2018-04-14 DIAGNOSIS — Z1389 Encounter for screening for other disorder: Secondary | ICD-10-CM | POA: Diagnosis not present

## 2018-04-14 DIAGNOSIS — Z6827 Body mass index (BMI) 27.0-27.9, adult: Secondary | ICD-10-CM | POA: Diagnosis not present

## 2018-04-14 DIAGNOSIS — J019 Acute sinusitis, unspecified: Secondary | ICD-10-CM | POA: Diagnosis not present

## 2018-09-28 ENCOUNTER — Other Ambulatory Visit: Payer: Self-pay | Admitting: Adult Health

## 2018-09-28 ENCOUNTER — Telehealth: Payer: Self-pay | Admitting: Adult Health

## 2018-09-28 NOTE — Telephone Encounter (Signed)
Pt called just requesting a return call from [facility]

## 2018-09-28 NOTE — Telephone Encounter (Signed)
Pt called requesting refill on valtrex. Informed pt that refill had been sent to her pharmacy. Pt verbalized understanding.

## 2018-10-07 DIAGNOSIS — Z1389 Encounter for screening for other disorder: Secondary | ICD-10-CM | POA: Diagnosis not present

## 2018-10-07 DIAGNOSIS — Z6827 Body mass index (BMI) 27.0-27.9, adult: Secondary | ICD-10-CM | POA: Diagnosis not present

## 2018-10-07 DIAGNOSIS — E663 Overweight: Secondary | ICD-10-CM | POA: Diagnosis not present

## 2018-10-07 DIAGNOSIS — J329 Chronic sinusitis, unspecified: Secondary | ICD-10-CM | POA: Diagnosis not present

## 2018-10-07 DIAGNOSIS — H8113 Benign paroxysmal vertigo, bilateral: Secondary | ICD-10-CM | POA: Diagnosis not present

## 2018-10-07 DIAGNOSIS — J9801 Acute bronchospasm: Secondary | ICD-10-CM | POA: Diagnosis not present

## 2018-11-17 DIAGNOSIS — H40033 Anatomical narrow angle, bilateral: Secondary | ICD-10-CM | POA: Diagnosis not present

## 2018-11-17 DIAGNOSIS — H04123 Dry eye syndrome of bilateral lacrimal glands: Secondary | ICD-10-CM | POA: Diagnosis not present

## 2019-08-30 ENCOUNTER — Other Ambulatory Visit: Payer: Self-pay

## 2019-08-30 DIAGNOSIS — Z20822 Contact with and (suspected) exposure to covid-19: Secondary | ICD-10-CM

## 2019-08-31 LAB — NOVEL CORONAVIRUS, NAA: SARS-CoV-2, NAA: NOT DETECTED

## 2019-09-06 DIAGNOSIS — Z20828 Contact with and (suspected) exposure to other viral communicable diseases: Secondary | ICD-10-CM | POA: Diagnosis not present

## 2019-09-06 DIAGNOSIS — J069 Acute upper respiratory infection, unspecified: Secondary | ICD-10-CM | POA: Diagnosis not present

## 2019-10-08 ENCOUNTER — Other Ambulatory Visit: Payer: BLUE CROSS/BLUE SHIELD | Admitting: Adult Health

## 2019-12-10 DIAGNOSIS — C4372 Malignant melanoma of left lower limb, including hip: Secondary | ICD-10-CM | POA: Diagnosis not present

## 2019-12-10 DIAGNOSIS — L818 Other specified disorders of pigmentation: Secondary | ICD-10-CM | POA: Diagnosis not present

## 2019-12-22 DIAGNOSIS — D485 Neoplasm of uncertain behavior of skin: Secondary | ICD-10-CM | POA: Diagnosis not present

## 2019-12-22 DIAGNOSIS — E7800 Pure hypercholesterolemia, unspecified: Secondary | ICD-10-CM | POA: Diagnosis not present

## 2019-12-22 DIAGNOSIS — C4372 Malignant melanoma of left lower limb, including hip: Secondary | ICD-10-CM | POA: Diagnosis not present

## 2020-01-14 DIAGNOSIS — Z01812 Encounter for preprocedural laboratory examination: Secondary | ICD-10-CM | POA: Diagnosis not present

## 2020-01-14 DIAGNOSIS — Z20822 Contact with and (suspected) exposure to covid-19: Secondary | ICD-10-CM | POA: Diagnosis not present

## 2020-01-17 DIAGNOSIS — Z885 Allergy status to narcotic agent status: Secondary | ICD-10-CM | POA: Diagnosis not present

## 2020-01-17 DIAGNOSIS — C774 Secondary and unspecified malignant neoplasm of inguinal and lower limb lymph nodes: Secondary | ICD-10-CM | POA: Diagnosis not present

## 2020-01-17 DIAGNOSIS — Z801 Family history of malignant neoplasm of trachea, bronchus and lung: Secondary | ICD-10-CM | POA: Diagnosis not present

## 2020-01-17 DIAGNOSIS — E785 Hyperlipidemia, unspecified: Secondary | ICD-10-CM | POA: Diagnosis not present

## 2020-01-17 DIAGNOSIS — C4372 Malignant melanoma of left lower limb, including hip: Secondary | ICD-10-CM | POA: Diagnosis not present

## 2020-01-24 DIAGNOSIS — Z9889 Other specified postprocedural states: Secondary | ICD-10-CM | POA: Diagnosis not present

## 2020-01-24 DIAGNOSIS — Z08 Encounter for follow-up examination after completed treatment for malignant neoplasm: Secondary | ICD-10-CM | POA: Diagnosis not present

## 2020-01-24 DIAGNOSIS — Z85820 Personal history of malignant melanoma of skin: Secondary | ICD-10-CM | POA: Diagnosis not present

## 2020-01-31 DIAGNOSIS — C774 Secondary and unspecified malignant neoplasm of inguinal and lower limb lymph nodes: Secondary | ICD-10-CM | POA: Diagnosis not present

## 2020-01-31 DIAGNOSIS — L905 Scar conditions and fibrosis of skin: Secondary | ICD-10-CM | POA: Diagnosis not present

## 2020-01-31 DIAGNOSIS — C4372 Malignant melanoma of left lower limb, including hip: Secondary | ICD-10-CM | POA: Diagnosis not present

## 2020-02-14 DIAGNOSIS — L905 Scar conditions and fibrosis of skin: Secondary | ICD-10-CM | POA: Diagnosis not present

## 2020-02-14 DIAGNOSIS — Z9889 Other specified postprocedural states: Secondary | ICD-10-CM | POA: Diagnosis not present

## 2020-02-14 DIAGNOSIS — C4372 Malignant melanoma of left lower limb, including hip: Secondary | ICD-10-CM | POA: Diagnosis not present

## 2020-03-06 DIAGNOSIS — C4372 Malignant melanoma of left lower limb, including hip: Secondary | ICD-10-CM | POA: Diagnosis not present

## 2020-03-27 ENCOUNTER — Telehealth: Payer: Self-pay | Admitting: Adult Health

## 2020-03-27 MED ORDER — FLUCONAZOLE 150 MG PO TABS: ORAL_TABLET | ORAL | 1 refills | Status: DC

## 2020-03-27 NOTE — Addendum Note (Signed)
Addended by: Derrek Monaco A on: 03/27/2020 11:14 AM   Modules accepted: Orders

## 2020-03-27 NOTE — Telephone Encounter (Signed)
Pt wants medicine sent to pharmacy for yeast infection

## 2020-03-27 NOTE — Telephone Encounter (Signed)
Will rx diflucan  

## 2020-03-27 NOTE — Telephone Encounter (Addendum)
Pt has vaginal itching. Feels like it's a yeast infection. Can you send in med? Thanks!! [place]

## 2020-04-17 DIAGNOSIS — C4372 Malignant melanoma of left lower limb, including hip: Secondary | ICD-10-CM | POA: Diagnosis not present

## 2020-04-17 DIAGNOSIS — C439 Malignant melanoma of skin, unspecified: Secondary | ICD-10-CM | POA: Diagnosis not present

## 2020-04-21 DIAGNOSIS — D225 Melanocytic nevi of trunk: Secondary | ICD-10-CM | POA: Diagnosis not present

## 2020-04-21 DIAGNOSIS — Z85820 Personal history of malignant melanoma of skin: Secondary | ICD-10-CM | POA: Diagnosis not present

## 2020-04-21 DIAGNOSIS — Z1283 Encounter for screening for malignant neoplasm of skin: Secondary | ICD-10-CM | POA: Diagnosis not present

## 2020-04-21 DIAGNOSIS — D485 Neoplasm of uncertain behavior of skin: Secondary | ICD-10-CM | POA: Diagnosis not present

## 2020-04-21 DIAGNOSIS — C44519 Basal cell carcinoma of skin of other part of trunk: Secondary | ICD-10-CM | POA: Diagnosis not present

## 2020-04-21 DIAGNOSIS — Z08 Encounter for follow-up examination after completed treatment for malignant neoplasm: Secondary | ICD-10-CM | POA: Diagnosis not present

## 2020-06-09 DIAGNOSIS — Z08 Encounter for follow-up examination after completed treatment for malignant neoplasm: Secondary | ICD-10-CM | POA: Diagnosis not present

## 2020-06-09 DIAGNOSIS — Z85828 Personal history of other malignant neoplasm of skin: Secondary | ICD-10-CM | POA: Diagnosis not present

## 2020-07-17 DIAGNOSIS — C439 Malignant melanoma of skin, unspecified: Secondary | ICD-10-CM | POA: Diagnosis not present

## 2020-07-17 DIAGNOSIS — C774 Secondary and unspecified malignant neoplasm of inguinal and lower limb lymph nodes: Secondary | ICD-10-CM | POA: Diagnosis not present

## 2020-07-17 DIAGNOSIS — L905 Scar conditions and fibrosis of skin: Secondary | ICD-10-CM | POA: Diagnosis not present

## 2020-10-13 DIAGNOSIS — Z85820 Personal history of malignant melanoma of skin: Secondary | ICD-10-CM | POA: Diagnosis not present

## 2020-10-13 DIAGNOSIS — Z08 Encounter for follow-up examination after completed treatment for malignant neoplasm: Secondary | ICD-10-CM | POA: Diagnosis not present

## 2020-10-13 DIAGNOSIS — D485 Neoplasm of uncertain behavior of skin: Secondary | ICD-10-CM | POA: Diagnosis not present

## 2020-10-13 DIAGNOSIS — D2262 Melanocytic nevi of left upper limb, including shoulder: Secondary | ICD-10-CM | POA: Diagnosis not present

## 2020-10-13 DIAGNOSIS — Z1283 Encounter for screening for malignant neoplasm of skin: Secondary | ICD-10-CM | POA: Diagnosis not present

## 2020-10-13 DIAGNOSIS — D2261 Melanocytic nevi of right upper limb, including shoulder: Secondary | ICD-10-CM | POA: Diagnosis not present

## 2020-10-13 DIAGNOSIS — D225 Melanocytic nevi of trunk: Secondary | ICD-10-CM | POA: Diagnosis not present

## 2020-10-27 DIAGNOSIS — H5213 Myopia, bilateral: Secondary | ICD-10-CM | POA: Diagnosis not present

## 2020-10-27 DIAGNOSIS — L988 Other specified disorders of the skin and subcutaneous tissue: Secondary | ICD-10-CM | POA: Diagnosis not present

## 2020-10-27 DIAGNOSIS — D485 Neoplasm of uncertain behavior of skin: Secondary | ICD-10-CM | POA: Diagnosis not present

## 2020-10-27 DIAGNOSIS — D2272 Melanocytic nevi of left lower limb, including hip: Secondary | ICD-10-CM | POA: Diagnosis not present

## 2020-10-27 DIAGNOSIS — H40033 Anatomical narrow angle, bilateral: Secondary | ICD-10-CM | POA: Diagnosis not present

## 2020-10-27 DIAGNOSIS — H04123 Dry eye syndrome of bilateral lacrimal glands: Secondary | ICD-10-CM | POA: Diagnosis not present

## 2020-11-27 ENCOUNTER — Other Ambulatory Visit (HOSPITAL_COMMUNITY): Payer: Self-pay | Admitting: Adult Health

## 2020-11-27 DIAGNOSIS — Z1231 Encounter for screening mammogram for malignant neoplasm of breast: Secondary | ICD-10-CM

## 2020-12-08 ENCOUNTER — Other Ambulatory Visit (HOSPITAL_COMMUNITY)
Admission: RE | Admit: 2020-12-08 | Discharge: 2020-12-08 | Disposition: A | Discharge status: Home / Self Care | Payer: BC Managed Care – PPO | Source: Ambulatory Visit | Attending: Adult Health | Admitting: Adult Health

## 2020-12-08 ENCOUNTER — Ambulatory Visit: Payer: BC Managed Care – PPO | Admitting: Adult Health

## 2020-12-08 ENCOUNTER — Ambulatory Visit (HOSPITAL_COMMUNITY)
Admission: RE | Admit: 2020-12-08 | Discharge: 2020-12-08 | Disposition: A | Discharge status: Home / Self Care | Payer: BC Managed Care – PPO | Source: Ambulatory Visit | Attending: Adult Health | Admitting: Adult Health

## 2020-12-08 ENCOUNTER — Encounter: Payer: Self-pay | Admitting: Adult Health

## 2020-12-08 ENCOUNTER — Other Ambulatory Visit: Payer: Self-pay

## 2020-12-08 VITALS — BP 100/66 | HR 79 | Ht 63.0 in | Wt 157.0 lb

## 2020-12-08 DIAGNOSIS — Z1151 Encounter for screening for human papillomavirus (HPV): Secondary | ICD-10-CM | POA: Diagnosis not present

## 2020-12-08 DIAGNOSIS — Z1211 Encounter for screening for malignant neoplasm of colon: Secondary | ICD-10-CM

## 2020-12-08 DIAGNOSIS — Z01419 Encounter for gynecological examination (general) (routine) without abnormal findings: Secondary | ICD-10-CM | POA: Diagnosis not present

## 2020-12-08 DIAGNOSIS — Z1231 Encounter for screening mammogram for malignant neoplasm of breast: Secondary | ICD-10-CM

## 2020-12-08 DIAGNOSIS — Z1212 Encounter for screening for malignant neoplasm of rectum: Secondary | ICD-10-CM

## 2020-12-08 LAB — HEMOCCULT GUIAC POC 1CARD (OFFICE): Fecal Occult Blood, POC: NEGATIVE

## 2020-12-08 NOTE — Progress Notes (Signed)
Patient ID: Karen Mays, female   DOB: 15-Nov-1960, 60 y.o.   MRN: [id number] History of Present Illness: Karen Mays is a 60 year old white female, married, PM in for well woman gyn exam and pap. 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 She is still working and has sold her house in lives in [place]. PCP is Dr Gerarda Fraction.   Current Medications, Allergies, Past Medical History, Past Surgical History, Family History and Social History were reviewed in [place] record.     Review of Systems:  Patient denies any headaches, hearing loss, fatigue, blurred vision, shortness of breath, chest pain, abdominal pain, problems with bowel movements, urination, or intercourse. No joint pain or mood swings.   Physical Exam:BP 100/66 (BP Location: Left Arm, Patient Position: Sitting, Cuff Size: Normal)   Pulse 79   Ht 5\' 3"  (1.6 m)   Wt 157 lb (71.2 kg)   LMP 11/18/2014   BMI 27.81 kg/m  General:  Well developed, well nourished, no acute distress Skin:  Warm and dry Neck:  Midline trachea, normal thyroid, good ROM, no lymphadenopathy Lungs; Clear to auscultation bilaterally Breast:  No dominant palpable mass, retraction, or nipple discharge Cardiovascular: Regular rate and rhythm Abdomen:  Soft, non tender, no hepatosplenomegaly Pelvic:  External genitalia is normal in appearance, no lesions.  The vagina is normal in appearance. Urethra has no lesions or masses. The cervix is smooth, pap with HRHPV genotyping performed.  Uterus is felt to be normal size, shape, and contour.  No adnexal masses or tenderness noted.Bladder is non tender, no masses felt. Rectal: Good sphincter tone, no polyps, or hemorrhoids felt.  Hemoccult negative. Extremities/musculoskeletal:  No swelling or varicosities noted, no clubbing or cyanosis Psych:  No mood changes, alert and cooperative,seems happy AA is 1 Fall risk is low PHQ 9 score is 0 GAD 7 score is 0  [facility] - 12/08/20 1128       Pregnancy Intention Screening   Does the patient want to become pregnant in the next year? N/A    Does the patient's partner want to become pregnant in the next year? N/A    Would the patient like to discuss contraceptive options today? N/A      Contraception Wrap Up   Current Method --   PM   End Method --   PM   Contraception Counseling Provided No         Examination chaperoned by Celene Squibb LPN.  Impression and Plan: 1. Encounter for gynecological examination with Papanicolaou smear of cervix Pap sent Physical in 1 year Pap in 3 if normal Mammogram today and yearly Labs with PCP  2. Encounter for screening fecal occult blood testing  3. Screening for colorectal cancer Referred to Dr Laural Golden for colonoscopy

## 2020-12-11 ENCOUNTER — Encounter (INDEPENDENT_AMBULATORY_CARE_PROVIDER_SITE_OTHER): Payer: Self-pay | Admitting: *Deleted

## 2020-12-11 LAB — CYTOLOGY - PAP
Comment: NEGATIVE
Diagnosis: NEGATIVE
High risk HPV: NEGATIVE

## 2021-01-01 DIAGNOSIS — C4370 Malignant melanoma of unspecified lower limb, including hip: Secondary | ICD-10-CM | POA: Diagnosis not present

## 2021-01-01 DIAGNOSIS — Z6828 Body mass index (BMI) 28.0-28.9, adult: Secondary | ICD-10-CM | POA: Diagnosis not present

## 2021-03-12 ENCOUNTER — Encounter: Payer: Self-pay | Admitting: Emergency Medicine

## 2021-03-12 ENCOUNTER — Ambulatory Visit
Admission: EM | Admit: 2021-03-12 | Discharge: 2021-03-12 | Disposition: A | Discharge status: Home / Self Care | Payer: BC Managed Care – PPO | Attending: Family Medicine | Admitting: Family Medicine

## 2021-03-12 ENCOUNTER — Other Ambulatory Visit: Payer: Self-pay

## 2021-03-12 DIAGNOSIS — H8111 Benign paroxysmal vertigo, right ear: Secondary | ICD-10-CM | POA: Diagnosis not present

## 2021-03-12 HISTORY — DX: Dizziness and giddiness: R42

## 2021-03-12 MED ORDER — ONDANSETRON 8 MG PO TBDP
8.0000 mg | ORAL_TABLET | Freq: Once | ORAL | Status: AC
  Administered 2021-03-12: 8 mg via ORAL

## 2021-03-12 MED ORDER — MECLIZINE HCL 25 MG PO TABS: 25.0000 mg | ORAL_TABLET | Freq: Three times a day (TID) | ORAL | 1 refills | Status: DC | PRN

## 2021-03-12 MED ORDER — DIAZEPAM 2 MG PO TABS: 1.0000 mg | ORAL_TABLET | Freq: Two times a day (BID) | ORAL | 0 refills | Status: DC | PRN

## 2021-03-12 NOTE — Discharge Instructions (Addendum)
Rest.  Medication as prescribed.  If you worsen, go to the ER. Call [facility] PT regarding Vestibular rehab.   Take care  Dr. Lacinda Axon

## 2021-03-12 NOTE — ED Provider Notes (Signed)
[facility]    CSN: [id number] Arrival date & time: 03/12/21  1752      History   Chief Complaint Chief Complaint  Patient presents with   Dizziness   Emesis   HPI  60 year old female presents the above complaints.  Started on Saturday.  Patient states that she has a history of vertigo.  She has been using meclizine with some improvement.  However, she has not been able to keep it down today.  She has had nausea and vomiting.  Continues to have dizziness particularly with head movement to the right.  No reports of vision changes.  No reported falls.  She has eaten today without difficulty.  She was unable to see her primary care physician.  Past Medical History:  Diagnosis Date   Acid reflux 11/24/2014   Contraceptive management 11/22/2013   Dyslipidemia 04/01/2017   Elevated cholesterol    GERD (gastroesophageal reflux disease)    Melanoma ([place])    t2a   Vertigo     Patient Active Problem List   Diagnosis Date Noted   Encounter for screening fecal occult blood testing 12/08/2020   Dyslipidemia 04/01/2017   Postmenopausal 03/28/2017   Encounter for gynecological examination with Papanicolaou smear of cervix 03/28/2017   Venous stasis dermatitis of left lower extremity 03/28/2017   Spider veins of both lower extremities 03/28/2017   Hot flashes due to menopause 03/28/2017   Acid reflux 11/24/2014   Contraceptive management 11/22/2013    Past Surgical History:  Procedure Laterality Date   excision of melanoma      OB History     Gravida  1   Para  1   Term  1   Preterm      AB      Living  1      SAB      IAB      Ectopic      Multiple      Live Births  1            Home Medications    Prior to Admission medications   Medication Sig Start Date End Date Taking? Authorizing Provider  diazepam (VALIUM) 2 MG tablet Take 0.5-1 tablets (1-2 mg total) by mouth every 12 (twelve) hours as needed for anxiety. 03/12/21  Yes Johnell Bas,  Christene Pounds G, DO  meclizine (ANTIVERT) 25 MG tablet Take by mouth. 02/29/20  Yes [provider]  NON FORMULARY X-factor vitamins Bio Cleanse Vita Biome Pro Bio5 [place]    [provider]  omeprazole (PRILOSEC) 20 MG capsule Take 20 mg by mouth daily.    [provider]    Family History Family History  Problem Relation Age of Onset   Hyperlipidemia Mother    Heart disease Mother    Diabetes Mother    Heart disease Father    Hyperlipidemia Father    Hypertension Father    Diabetes Father    Other Father        has a pacemaker   Diabetes Sister        borderline   Cancer Paternal Aunt        stomach   Diabetes Paternal Uncle    Hypertension Paternal Uncle    Cancer Paternal Aunt        brain tumor   Diabetes Paternal Aunt    Hypertension Paternal Aunt    Cancer Paternal Aunt        ovarian   Diabetes Paternal Aunt  Hypertension Paternal Aunt    Cancer Cousin 77       ovarian   Hypertension Paternal Uncle    Diabetes Paternal Uncle    Cancer Maternal Grandmother        lung   Cancer Maternal Grandfather        lung   Heart attack Paternal Grandmother    Other Paternal Grandfather        aneursym   Diabetes Paternal Aunt    Hypertension Paternal Aunt    Diabetes Paternal Aunt    Hypertension Paternal Aunt    Diabetes Paternal Aunt    Hypertension Paternal Aunt    Diabetes Paternal Aunt    Hypertension Paternal Aunt    Diabetes Paternal Uncle    Hypertension Paternal Uncle    Diabetes Paternal Uncle    Hypertension Paternal Uncle    Diabetes Paternal Uncle    Hypertension Paternal Uncle    Diabetes Paternal Uncle    Hypertension Paternal Uncle    Diabetes Paternal Uncle    Hypertension Paternal Uncle     Social History Social History   Tobacco Use   Smoking status: Never   Smokeless tobacco: Never  Vaping Use   Vaping Use: Never used  Substance Use Topics   Alcohol use: No   Drug use: No     Allergies    Codeine   Review of Systems Review of Systems  Gastrointestinal:  Positive for nausea and vomiting.  Neurological:  Positive for dizziness.    Physical Exam Triage Vital Signs ED Triage Vitals  Enc Vitals Group     BP 03/12/21 1854 127/72     Pulse Rate 03/12/21 1854 66     Resp 03/12/21 1854 18     Temp 03/12/21 1854 98.2 F (36.8 C)     Temp Source 03/12/21 1854 Oral     SpO2 03/12/21 1854 97 %     Weight --      Height --      Head Circumference --      Peak Flow --      Pain Score 03/12/21 1851 0     Pain Loc --      Pain Edu? --      Excl. in [place]? --    Updated Vital Signs BP 127/72 (BP Location: Left Arm)   Pulse 66   Temp 98.2 F (36.8 C) (Oral)   Resp 18   LMP 11/18/2014   SpO2 97%   Visual Acuity Right Eye Distance:   Left Eye Distance:   Bilateral Distance:    Right Eye Near:   Left Eye Near:    Bilateral Near:     Physical Exam Constitutional:      General: She is not in acute distress.    Comments: Appears mildly ill.  HENT:     Head: Normocephalic and atraumatic.  Eyes:     General:        Right eye: No discharge.        Left eye: No discharge.     Extraocular Movements: Extraocular movements intact.     Conjunctiva/sclera: Conjunctivae normal.  Cardiovascular:     Rate and Rhythm: Normal rate and regular rhythm.     Heart sounds: No murmur heard. Pulmonary:     Effort: Pulmonary effort is normal.     Breath sounds: Normal breath sounds. No wheezing, rhonchi or rales.  Neurological:     Mental Status: She is alert.  Psychiatric:  Mood and Affect: Mood normal.        Behavior: Behavior normal.     UC Treatments / Results  Labs (all labs ordered are listed, but only abnormal results are displayed) Labs Reviewed - No data to display  EKG   Radiology No results found.  Procedures Procedures (including critical care time)  Medications Ordered in UC Medications  ondansetron (ZOFRAN-ODT) disintegrating tablet 8 mg  (8 mg Oral Given 03/12/21 1927)    Initial Impression / Assessment and Plan / UC Course  I have reviewed the triage vital signs and the nursing notes.  Pertinent labs & imaging results that were available during my care of the patient were reviewed by me and considered in my medical decision making (see chart for details).    60 year old female presents with BPPV. Treating with Valium. Zofran given here today.  Referral placed to PT for vestibular rehab.  Advised use of Epley maneuver if she can tolerate.  Supportive care.  Final Clinical Impressions(s) / UC Diagnoses   Final diagnoses:  Benign paroxysmal positional vertigo of right ear     Discharge Instructions      Rest.  Medication as prescribed.  If you worsen, go to the ER. Call [facility] PT regarding Vestibular rehab.   Take care  Dr. Lacinda Axon    ED Prescriptions     Medication Sig Dispense Auth. Provider   diazepam (VALIUM) 2 MG tablet Take 0.5-1 tablets (1-2 mg total) by mouth every 12 (twelve) hours as needed for anxiety. 10 tablet Coral Spikes, DO      PDMP not reviewed this encounter.   Coral Spikes, [place] 03/12/21 2003

## 2021-03-12 NOTE — ED Triage Notes (Signed)
Pt presents today along with husband. She c/o of nausea and dizziness that began on Saturday. She has been taking Meclizine, but vomited it up today. History of same.

## 2021-04-13 DIAGNOSIS — Z08 Encounter for follow-up examination after completed treatment for malignant neoplasm: Secondary | ICD-10-CM | POA: Diagnosis not present

## 2021-04-13 DIAGNOSIS — D485 Neoplasm of uncertain behavior of skin: Secondary | ICD-10-CM | POA: Diagnosis not present

## 2021-04-13 DIAGNOSIS — D225 Melanocytic nevi of trunk: Secondary | ICD-10-CM | POA: Diagnosis not present

## 2021-04-13 DIAGNOSIS — D2272 Melanocytic nevi of left lower limb, including hip: Secondary | ICD-10-CM | POA: Diagnosis not present

## 2021-04-13 DIAGNOSIS — Z1283 Encounter for screening for malignant neoplasm of skin: Secondary | ICD-10-CM | POA: Diagnosis not present

## 2021-04-13 DIAGNOSIS — Z85820 Personal history of malignant melanoma of skin: Secondary | ICD-10-CM | POA: Diagnosis not present

## 2021-05-04 DIAGNOSIS — L988 Other specified disorders of the skin and subcutaneous tissue: Secondary | ICD-10-CM | POA: Diagnosis not present

## 2021-05-04 DIAGNOSIS — D485 Neoplasm of uncertain behavior of skin: Secondary | ICD-10-CM | POA: Diagnosis not present

## 2021-05-04 DIAGNOSIS — D225 Melanocytic nevi of trunk: Secondary | ICD-10-CM | POA: Diagnosis not present

## 2021-07-04 DIAGNOSIS — C4372 Malignant melanoma of left lower limb, including hip: Secondary | ICD-10-CM | POA: Diagnosis not present

## 2021-07-04 DIAGNOSIS — Z6828 Body mass index (BMI) 28.0-28.9, adult: Secondary | ICD-10-CM | POA: Diagnosis not present

## 2021-07-04 DIAGNOSIS — Z885 Allergy status to narcotic agent status: Secondary | ICD-10-CM | POA: Diagnosis not present

## 2021-08-10 IMAGING — MG MM DIGITAL SCREENING BILAT W/ TOMO AND CAD
8 series · 9 of 24 positions shown · non-contrast
Comparison: Previous exam(s).

CLINICAL DATA: Screening.

EXAM:
DIGITAL SCREENING BILATERAL MAMMOGRAM WITH TOMOSYNTHESIS AND CAD
TECHNIQUE: Bilateral screening digital craniocaudal and mediolateral oblique
mammograms were obtained. Bilateral screening digital breast
tomosynthesis was performed. The images were evaluated with
computer-aided detection.

[R MLO synth-2D]
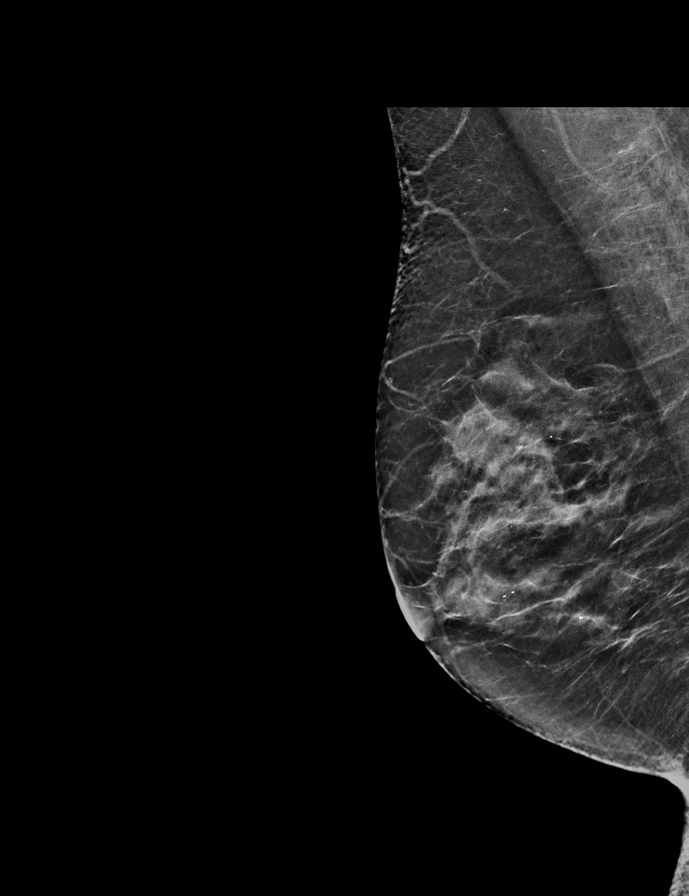

[L MLO synth-2D]
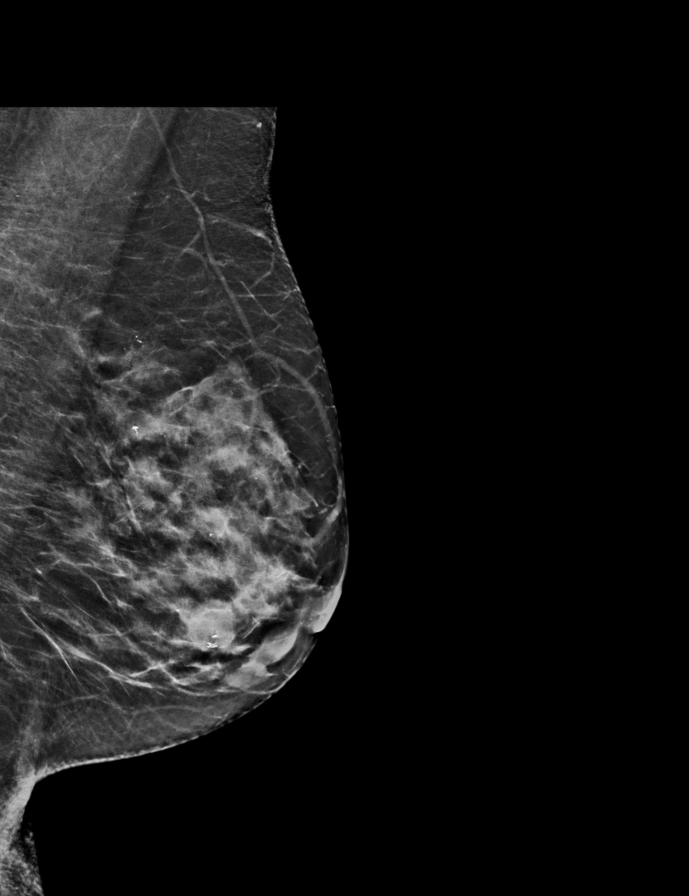

[L CC synth-2D]
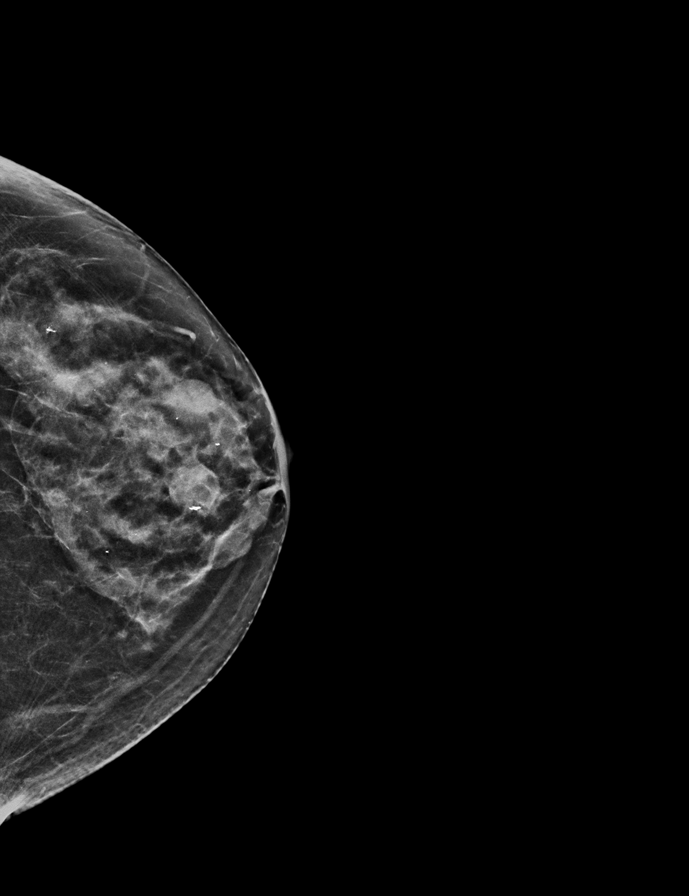

[R CC synth-2D]
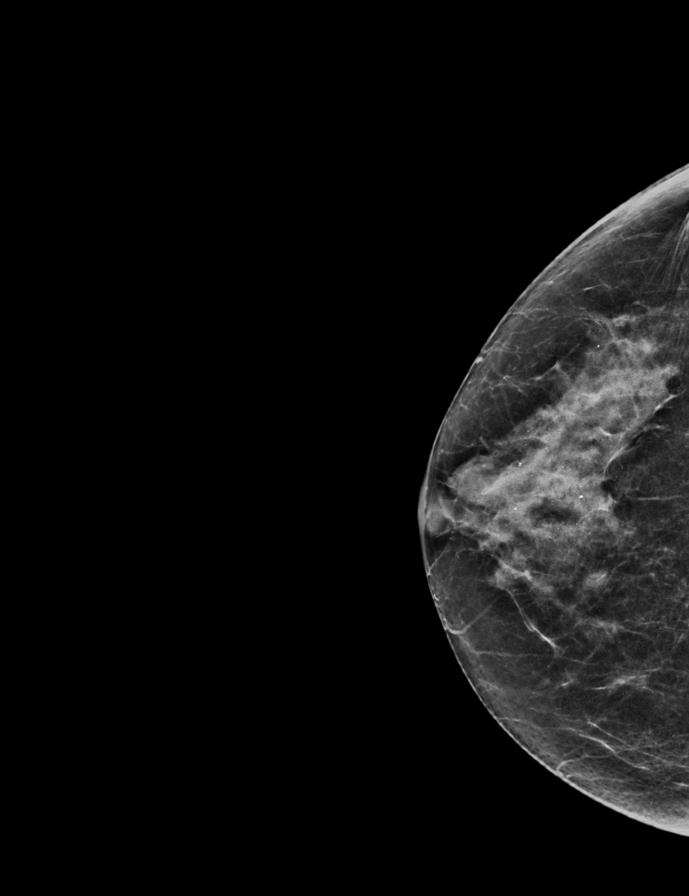

[L CC tomo · 2 of 54 frames shown]
[frame 18/54]
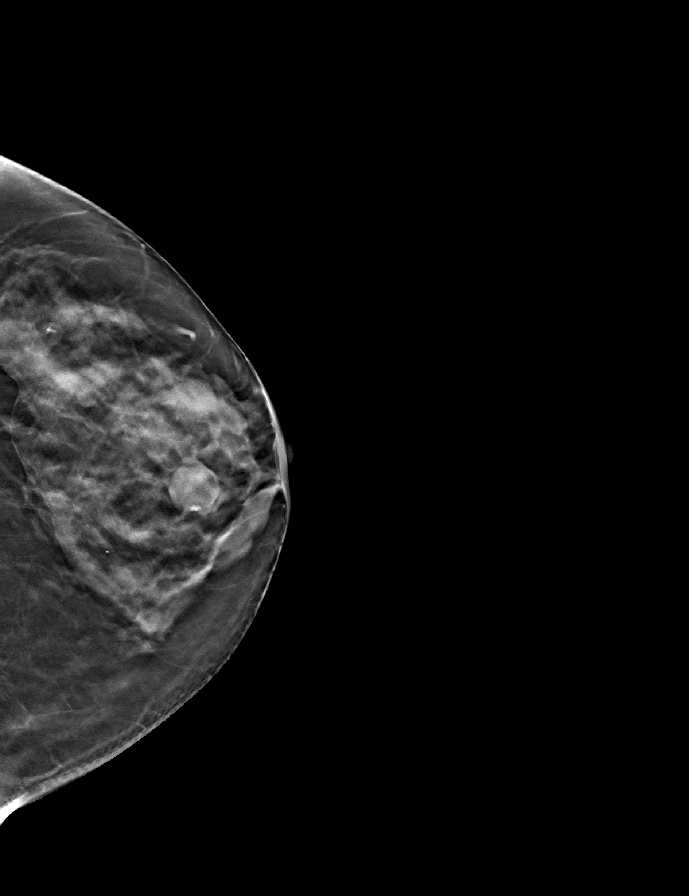
[frame 27/54]
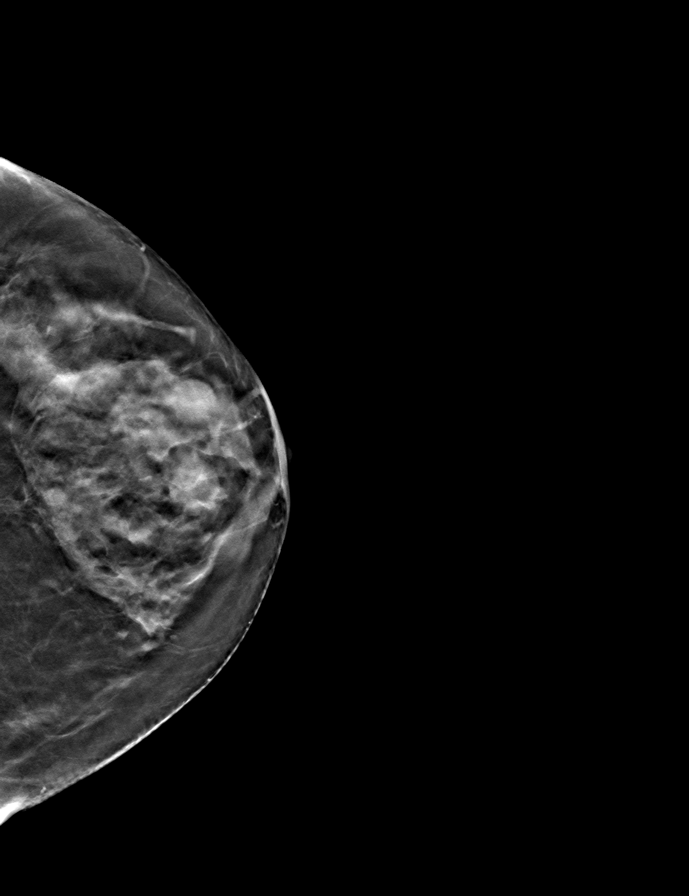

[R MLO tomo · tomo slice 26/51.0]
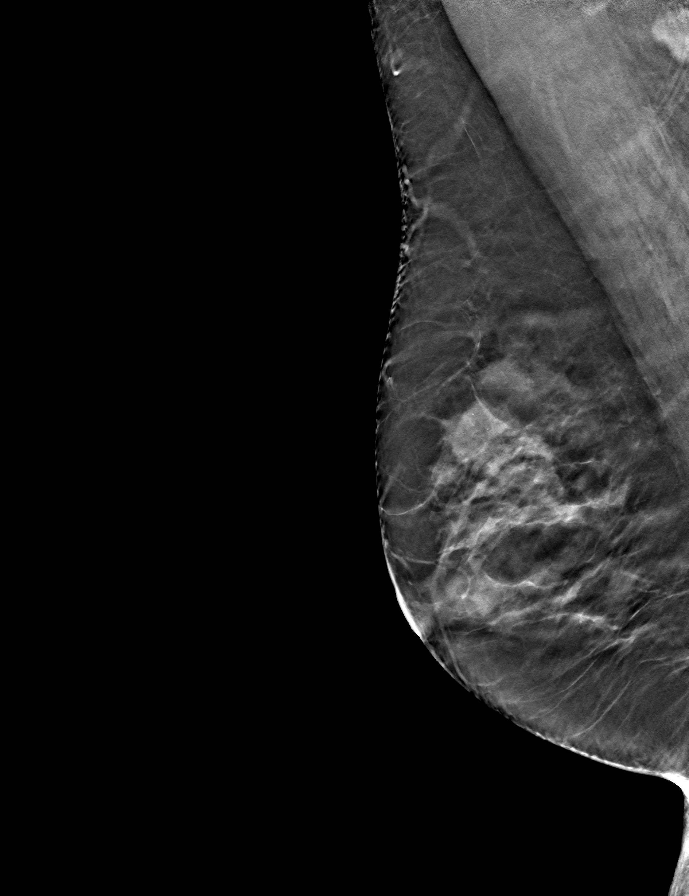

[L MLO tomo · tomo slice 29/56.0]
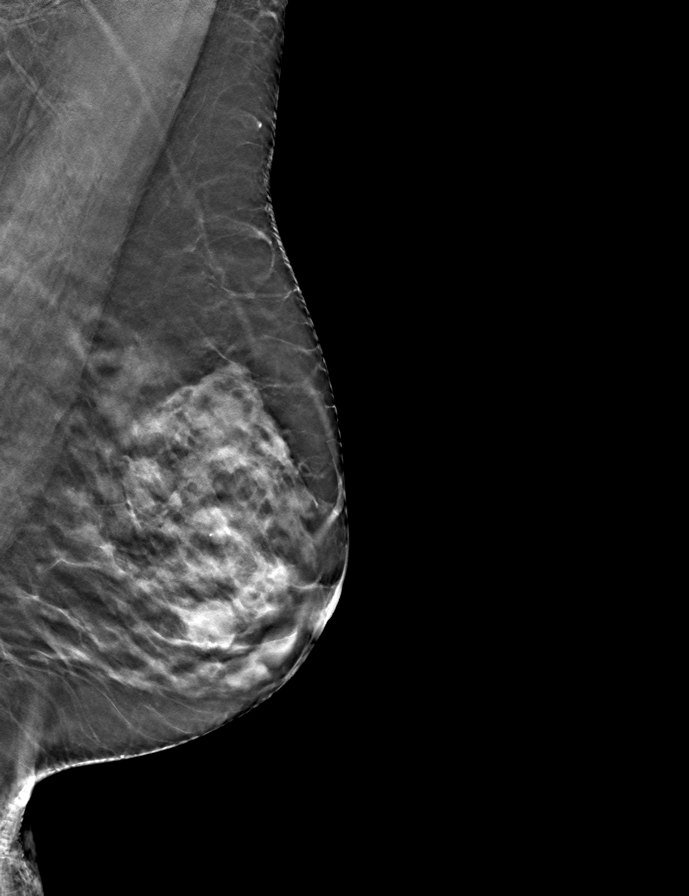

[R CC tomo · tomo slice 27/53.0]
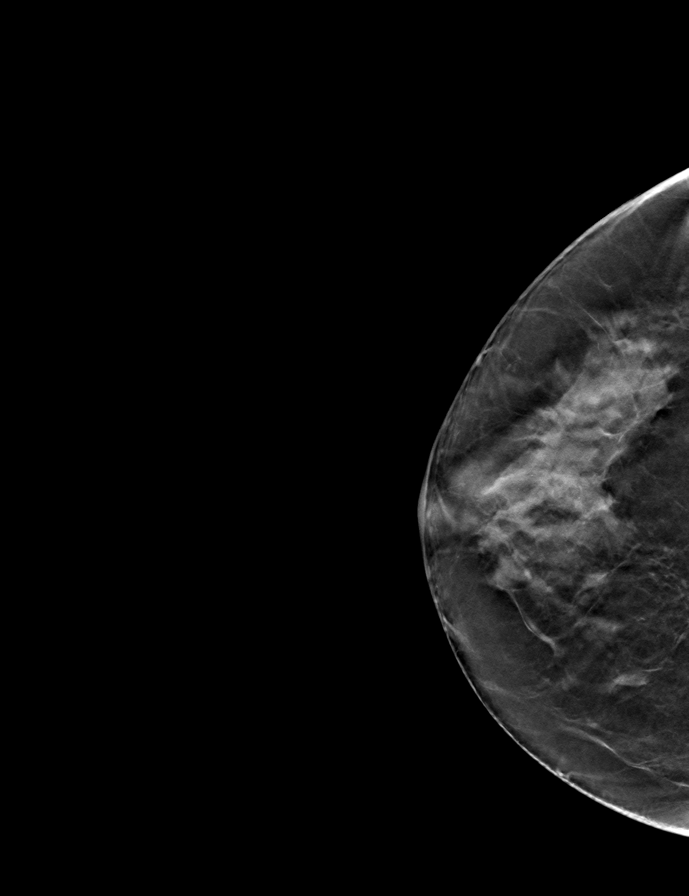

[9 of 24 positions shown; findings below may reference images not displayed]

ACR Breast Density Category c: The breast tissue is heterogeneously
dense, which may obscure small masses.
FINDINGS: There are no findings suspicious for malignancy. The images were
evaluated with computer-aided detection.
IMPRESSION: No mammographic evidence of malignancy. A result letter of this
screening mammogram will be mailed directly to the patient.

RECOMMENDATION:
Screening mammogram in one year. (Code:T4-5-GWO)

BI-RADS CATEGORY  1: Negative.

## 2021-09-18 ENCOUNTER — Ambulatory Visit
Admission: EM | Admit: 2021-09-18 | Discharge: 2021-09-18 | Disposition: A | Discharge status: Home / Self Care | Payer: BC Managed Care – PPO | Attending: Emergency Medicine | Admitting: Emergency Medicine

## 2021-09-18 ENCOUNTER — Other Ambulatory Visit: Payer: Self-pay

## 2021-09-18 DIAGNOSIS — J020 Streptococcal pharyngitis: Secondary | ICD-10-CM | POA: Insufficient documentation

## 2021-09-18 LAB — GROUP A STREP BY PCR: Group A Strep by PCR: DETECTED — AB

## 2021-09-18 MED ORDER — PENICILLIN V POTASSIUM 500 MG PO TABS: 500.0000 mg | ORAL_TABLET | Freq: Two times a day (BID) | ORAL | 0 refills | Status: AC

## 2021-09-18 MED ORDER — IBUPROFEN 600 MG PO TABS: 600.0000 mg | ORAL_TABLET | Freq: Four times a day (QID) | ORAL | 0 refills | Status: DC | PRN

## 2021-09-18 MED ORDER — FLUTICASONE PROPIONATE 50 MCG/ACT NA SUSP: 2.0000 | Freq: Every day | NASAL | 0 refills | Status: DC

## 2021-09-18 NOTE — ED Provider Notes (Signed)
HPI  SUBJECTIVE:  Patient reports sore throat starting last night. Sx worse with swallowing.  Sx better with hot liquids.  She also states that her right ear feels stopped up.  No ear pain, change in hearing, otorrhea.   No fever  No neck stiffness  No Cough + nasal congestion, postnasal drip, no rhinorrhea No Myalgias No Headache No Rash  No loss of taste or smell No shortness of breath or difficulty breathing No nausea, vomiting No diarrhea No abdominal pain     No Recent Strep, mono, flu, COVID exposure She did not get the flu or COVID vaccines No reflux sxs No Allergy sxs  No Breathing difficulty, voice changes,  + Felt like her throat was swelling shut last night while lying down, resolves when she sits up  no Drooling No Trismus No abx in past month.  No antipyretic in past 4-6 hrs She has had COVID twice, last bout in 2020, anxiety. PMD: In [place].  She is looking to establish care locally.   Past Medical History:  Diagnosis Date   Acid reflux 11/24/2014   Contraceptive management 11/22/2013   Dyslipidemia 04/01/2017   Elevated cholesterol    GERD (gastroesophageal reflux disease)    Melanoma ([place])    t2a   Vertigo     Past Surgical History:  Procedure Laterality Date   excision of melanoma      Family History  Problem Relation Age of Onset   Hyperlipidemia Mother    Heart disease Mother    Diabetes Mother    Heart disease Father    Hyperlipidemia Father    Hypertension Father    Diabetes Father    Other Father        has a pacemaker   Diabetes Sister        borderline   Cancer Paternal Aunt        stomach   Diabetes Paternal Uncle    Hypertension Paternal Uncle    Cancer Paternal Aunt        brain tumor   Diabetes Paternal Aunt    Hypertension Paternal Aunt    Cancer Paternal Aunt        ovarian   Diabetes Paternal Aunt    Hypertension Paternal Aunt    Cancer Cousin 13       ovarian   Hypertension Paternal Uncle    Diabetes  Paternal Uncle    Cancer Maternal Grandmother        lung   Cancer Maternal Grandfather        lung   Heart attack Paternal Grandmother    Other Paternal Grandfather        aneursym   Diabetes Paternal Aunt    Hypertension Paternal Aunt    Diabetes Paternal Aunt    Hypertension Paternal Aunt    Diabetes Paternal Aunt    Hypertension Paternal Aunt    Diabetes Paternal Aunt    Hypertension Paternal Aunt    Diabetes Paternal Uncle    Hypertension Paternal Uncle    Diabetes Paternal Uncle    Hypertension Paternal Uncle    Diabetes Paternal Uncle    Hypertension Paternal Uncle    Diabetes Paternal Uncle    Hypertension Paternal Uncle    Diabetes Paternal Uncle    Hypertension Paternal Uncle     Social History   Tobacco Use   Smoking status: Never   Smokeless tobacco: Never  Vaping Use   Vaping Use: Never used  Substance Use Topics  Alcohol use: No   Drug use: No    No current facility-administered medications for this encounter.  Current Outpatient Medications:    fluticasone (FLONASE) 50 MCG/ACT nasal spray, Place 2 sprays into both nostrils daily., Disp: 16 g, Rfl: 0   ibuprofen (ADVIL) 600 MG tablet, Take 1 tablet (600 mg total) by mouth every 6 (six) hours as needed., Disp: 30 tablet, Rfl: 0   penicillin v potassium (VEETID) 500 MG tablet, Take 1 tablet (500 mg total) by mouth 2 (two) times daily for 10 days. X 10 days, Disp: 20 tablet, Rfl: 0   diazepam (VALIUM) 2 MG tablet, Take 0.5-1 tablets (1-2 mg total) by mouth every 12 (twelve) hours as needed for anxiety., Disp: 10 tablet, Rfl: 0   NON FORMULARY, X-factor vitamins Bio Cleanse Vita Biome Pro Bio5 [place], Disp: , Rfl:    omeprazole (PRILOSEC) 20 MG capsule, Take 20 mg by mouth daily., Disp: , Rfl:   Allergies  Allergen Reactions   Codeine Palpitations     ROS  As noted in HPI.   Physical Exam  BP 128/87 (BP Location: Left Arm)    Pulse 90    Temp 98 F (36.7 C) (Oral)    Resp 18    Wt 68 kg    LMP  11/18/2014    SpO2 99%    BMI 26.57 kg/m   Constitutional: Well developed, well nourished, no acute distress Eyes:  EOMI, conjunctiva normal bilaterally HENT: Normocephalic, atraumatic,mucus membranes moist.  Positive mild nasal congestion.  Erythematous oropharynx.  Tonsils normal size without exudates.  Uvula midline. Respiratory: Normal inspiratory effort Cardiovascular: Normal rate, no murmurs, rubs, gallops GI: nondistended, nontender. No appreciable splenomegaly skin: No rash, skin intact Lymph: No anterior cervical LN.  No posterior cervical lymphadenopathy Musculoskeletal: no deformities Neurologic: Alert & oriented x 3, no focal neuro deficits Psychiatric: Speech and behavior appropriate.   ED Course   Medications - No data to display  Orders Placed This Encounter  Procedures   Group A Strep by PCR    Standing Status:   Standing    Number of Occurrences:   1    Order Specific Question:   Patient immune status    Answer:   Normal    Order Specific Question:   Release to patient    Answer:   Immediate    Results for orders placed or performed during the hospital encounter of 09/18/21 (from the past 24 hour(s))  Group A Strep by PCR     Status: Abnormal   Collection Time: 09/18/21 10:03 AM   Specimen: Throat; Sterile Swab  Result Value Ref Range   Group A Strep by PCR DETECTED (A) NOT DETECTED   No results found.  ED Clinical Impression  1. Strep pharyngitis      ED Assessment/Plan  strep PCR positive.  Patient declined COVID testing.  Sending home with penicillin for 10 days . Home with ibuprofen, Tylenol, Benadryl/Maalox mixture, Flonase, saline nasal irrigation, Mucinex D. Patient to followup with PMD of choice when necessary, will refer to local primary care resources.  Ordering assistance in finding a PMD as well.  Discussed labs,  MDM, plan and followup with patient. Discussed sn/sx that should prompt return to the ED. patient agrees with plan.   Meds  ordered this encounter  Medications   penicillin v potassium (VEETID) 500 MG tablet    Sig: Take 1 tablet (500 mg total) by mouth 2 (two) times daily for 10 days. X 10  days    Dispense:  20 tablet    Refill:  0   ibuprofen (ADVIL) 600 MG tablet    Sig: Take 1 tablet (600 mg total) by mouth every 6 (six) hours as needed.    Dispense:  30 tablet    Refill:  0   fluticasone (FLONASE) 50 MCG/ACT nasal spray    Sig: Place 2 sprays into both nostrils daily.    Dispense:  16 g    Refill:  0     *This clinic note was created using [employer]. Therefore, there may be occasional mistakes despite careful proofreading.     Melynda Ripple, MD 09/19/21 [phone]

## 2021-09-18 NOTE — ED Triage Notes (Signed)
Patient is here for "s/t, congestion". Started with "scratchy throat" then last night "felt as if I was choking". No runny nose "but congestion too". "Feels like a foreign body in throat". No fever. No @ home COVID19 testing. No COVID19 vaccines. No Flu vaccine.

## 2021-09-18 NOTE — Discharge Instructions (Addendum)
1 gram of Tylenol and 600 mg ibuprofen together 3-4 times a day as needed for pain.  Make sure you drink plenty of extra fluids.  Some people find salt water gargles and  Traditional Medicinal's "Throat Coat" tea helpful. Take 5 mL of liquid Benadryl and 5 mL of Maalox. Mix it together, and then hold it in your mouth for as long as you can and then swallow. You may do this 4 times a day.  Flonase, saline nasal irrigation with a Milta Deiters Med rinse and distilled water as often as you want, Mucinex D if the nasal congestion becomes bothersome.  Here is a list of primary care providers who are taking new patients:  Dr. Otilio Miu [address] [place] [address] [facility], [place] [address] [phone]  [facility] [place] [address]  [phone]  [facility] [address] [place], [place] [address] [phone]  [facility] [place]  [phone] [place], [place] [address]  Here are clinics/ other resources who will see you if you do not have insurance. Some have certain criteria that you must meet. Call them and find out what they are:  [facility]: [address], [place], [place] [address] Phone: [phone] Hours: First and Third Saturdays of each Month, 9 a.m. - 1 p.m.  [facility]: [address] [place], [place], [place] [address] Phone: [phone] Hours: Tuesday, 4 p.m. - 8 p.m. Thursday, 1 p.m. - 8 p.m. Wednesday, 9 a.m. - [facility] [address], [place], [place] [address] Phone: [phone] Pharmacy Phone Number: [phone] Dental Phone Number: [phone] [place] Help: [phone]  Dental Hours: Monday - Thursday, 8 a.m. - 6 p.m.  [facility] [address] [place], [place] [address] Phone: [phone] Pharmacy Phone Number: [phone] [facility] Insurance  Help: [phone]  [facility] [address] [place], [place] [address] Phone: [phone] Pharmacy Phone Number: [phone] [facility] Insurance Help: [phone]  [facility] [address] [place], [place] [address] Phone: [phone] [facility] Insurance Help: [phone]   [address] [place], [place] [address] Phone: [phone]  Go to www.goodrx.com  or www.costplusdrugs.com to look up your medications. This will give you a list of where you can find your prescriptions at the most affordable prices. Or ask the pharmacist what the cash price is, or if they have any other discount programs available to help make your medication more affordable. This can be less expensive than what you would pay with insurance.

## 2021-09-19 ENCOUNTER — Other Ambulatory Visit: Payer: Self-pay | Admitting: Adult Health

## 2021-10-02 DIAGNOSIS — U071 COVID-19: Secondary | ICD-10-CM | POA: Diagnosis not present

## 2021-10-19 DIAGNOSIS — Z85820 Personal history of malignant melanoma of skin: Secondary | ICD-10-CM | POA: Diagnosis not present

## 2021-10-19 DIAGNOSIS — Z1283 Encounter for screening for malignant neoplasm of skin: Secondary | ICD-10-CM | POA: Diagnosis not present

## 2021-10-19 DIAGNOSIS — Z08 Encounter for follow-up examination after completed treatment for malignant neoplasm: Secondary | ICD-10-CM | POA: Diagnosis not present

## 2021-10-19 DIAGNOSIS — D225 Melanocytic nevi of trunk: Secondary | ICD-10-CM | POA: Diagnosis not present

## 2021-10-29 ENCOUNTER — Other Ambulatory Visit: Payer: Self-pay

## 2021-10-29 ENCOUNTER — Other Ambulatory Visit (INDEPENDENT_AMBULATORY_CARE_PROVIDER_SITE_OTHER): Payer: BC Managed Care – PPO

## 2021-10-29 ENCOUNTER — Telehealth: Payer: Self-pay | Admitting: Adult Health

## 2021-10-29 DIAGNOSIS — R399 Unspecified symptoms and signs involving the genitourinary system: Secondary | ICD-10-CM | POA: Diagnosis not present

## 2021-10-29 NOTE — Progress Notes (Signed)
° °  NURSE VISIT- UTI SYMPTOMS   SUBJECTIVE:  Karen Mays is a 61 y.o. G42P1001 female here for UTI symptoms. She is a GYN patient. She reports urinary frequency, urinary urgency, and pressure .  OBJECTIVE:  LMP 11/18/2014   Appears well, in no apparent distress  No results found for this or any previous visit (from the past 24 hour(s)).  ASSESSMENT: GYN patient with UTI symptoms and unclear nitrites  PLAN: Discussed with Derrek Monaco, AGNP   Rx sent by provider today: No Urine culture sent Call or return to clinic prn if these symptoms worsen or fail to improve as anticipated. Follow-up: as needed   Kedrick Mcnamee A Loura Pitt  10/29/2021 3:58 PM

## 2021-10-29 NOTE — Telephone Encounter (Signed)
Patient called stating that she is taking a medication for a Bladder infection and she would like to know if this is going to mess with the results of her urine specimen. Please contact pt

## 2021-10-29 NOTE — Telephone Encounter (Signed)
Patient requests a call from [place].

## 2021-10-29 NOTE — Telephone Encounter (Signed)
Pt is taking Uristat to help with urinary discomfort. She feels like it is helping. Pt advised she can keep taking that med. Pt voiced understanding.  [place]

## 2021-10-29 NOTE — Telephone Encounter (Signed)
Pt is having pressure with urination X 4 days. I advised pt to come in today so we can check her urine. Pt is to come in at 3:30 today for a nurse visit. If pt can't come, she will let us know. [place]

## 2021-10-31 LAB — URINE CULTURE

## 2021-11-02 ENCOUNTER — Telehealth: Payer: Self-pay | Admitting: *Deleted

## 2021-11-02 ENCOUNTER — Other Ambulatory Visit: Payer: Self-pay

## 2021-11-02 ENCOUNTER — Ambulatory Visit
Admission: EM | Admit: 2021-11-02 | Discharge: 2021-11-02 | Disposition: A | Discharge status: Home / Self Care | Payer: BC Managed Care – PPO | Attending: Emergency Medicine | Admitting: Emergency Medicine

## 2021-11-02 DIAGNOSIS — R102 Pelvic and perineal pain: Secondary | ICD-10-CM | POA: Insufficient documentation

## 2021-11-02 LAB — URINALYSIS, ROUTINE W REFLEX MICROSCOPIC
Bilirubin Urine: NEGATIVE
Glucose, UA: NEGATIVE mg/dL
Hgb urine dipstick: NEGATIVE
Leukocytes,Ua: NEGATIVE
Nitrite: NEGATIVE
Protein, ur: NEGATIVE mg/dL
Specific Gravity, Urine: 1.02 (ref 1.005–1.030)
pH: 6 (ref 5.0–8.0)

## 2021-11-02 LAB — WET PREP, GENITAL
Clue Cells Wet Prep HPF POC: NONE SEEN
Sperm: NONE SEEN
Trich, Wet Prep: NONE SEEN
WBC, Wet Prep HPF POC: <10 (ref <10)
Yeast Wet Prep HPF POC: NONE SEEN

## 2021-11-02 NOTE — ED Provider Notes (Signed)
[facility]    CSN: [id number] Arrival date & time: 11/02/21  1237      History   Chief Complaint No chief complaint on file.   HPI Karen Mays is a 61 y.o. female.   Patient presents with lower abdominal/pelvic pressure for 1 week.  Endorses sensation of her bilateral ovaries cramping .  Was seen by her PCP who did a urinalysis that was negative earlier this week.  Patient is postmenopausal, uterus still present.  Denies vaginal bleeding or spotting, dysuria, frequency, urgency, vaginal discharge, itching, irritation, any rash or lesions, nausea, vomiting, diarrhea or constipation, heartburn or indigestion, increased gas production, bloating.  Denies history of uterine fibroids or cyst  .   Past Medical History:  Diagnosis Date   Acid reflux 11/24/2014   Contraceptive management 11/22/2013   Dyslipidemia 04/01/2017   Elevated cholesterol    GERD (gastroesophageal reflux disease)    Melanoma ([place])    t2a   Vertigo     Patient Active Problem List   Diagnosis Date Noted   Encounter for screening fecal occult blood testing 12/08/2020   Dyslipidemia 04/01/2017   Postmenopausal 03/28/2017   Encounter for gynecological examination with Papanicolaou smear of cervix 03/28/2017   Venous stasis dermatitis of left lower extremity 03/28/2017   Spider veins of both lower extremities 03/28/2017   Hot flashes due to menopause 03/28/2017   Acid reflux 11/24/2014   Contraceptive management 11/22/2013    Past Surgical History:  Procedure Laterality Date   excision of melanoma      OB History     Gravida  1   Para  1   Term  1   Preterm      AB      Living  1      SAB      IAB      Ectopic      Multiple      Live Births  1            Home Medications    Prior to Admission medications   Medication Sig Start Date End Date Taking? Authorizing Provider  diazepam (VALIUM) 2 MG tablet Take 0.5-1 tablets (1-2 mg total) by mouth every 12  (twelve) hours as needed for anxiety. 03/12/21  Yes Cook, Barnie Del, DO  NON FORMULARY X-factor vitamins Bio Cleanse Vita Biome Pro Bio5 MegaX   Yes [provider]  omeprazole (PRILOSEC) 20 MG capsule Take 20 mg by mouth daily.   Yes [provider]  fluconazole (DIFLUCAN) 150 MG tablet TAKE 1 TABLET BY MOUTH NOW. REPEAT 1 IN 3 DAYS AS NEEDED 09/20/21   Derrek Monaco A, NP  fluticasone (FLONASE) 50 MCG/ACT nasal spray Place 2 sprays into both nostrils daily. 09/18/21   Melynda Ripple, MD  ibuprofen (ADVIL) 600 MG tablet Take 1 tablet (600 mg total) by mouth every 6 (six) hours as needed. 09/18/21   Melynda Ripple, MD    Family History Family History  Problem Relation Age of Onset   Hyperlipidemia Mother    Heart disease Mother    Diabetes Mother    Heart disease Father    Hyperlipidemia Father    Hypertension Father    Diabetes Father    Other Father        has a pacemaker   Diabetes Sister        borderline   Cancer Paternal Aunt        stomach   Diabetes Paternal Uncle  Hypertension Paternal Uncle    Cancer Paternal Aunt        brain tumor   Diabetes Paternal Aunt    Hypertension Paternal Aunt    Cancer Paternal Aunt        ovarian   Diabetes Paternal Aunt    Hypertension Paternal Aunt    Cancer Cousin 43       ovarian   Hypertension Paternal Uncle    Diabetes Paternal Uncle    Cancer Maternal Grandmother        lung   Cancer Maternal Grandfather        lung   Heart attack Paternal Grandmother    Other Paternal Grandfather        aneursym   Diabetes Paternal Aunt    Hypertension Paternal Aunt    Diabetes Paternal Aunt    Hypertension Paternal Aunt    Diabetes Paternal Aunt    Hypertension Paternal Aunt    Diabetes Paternal Aunt    Hypertension Paternal Aunt    Diabetes Paternal Uncle    Hypertension Paternal Uncle    Diabetes Paternal Uncle    Hypertension Paternal Uncle    Diabetes Paternal Uncle    Hypertension Paternal  Uncle    Diabetes Paternal Uncle    Hypertension Paternal Uncle    Diabetes Paternal Uncle    Hypertension Paternal Uncle     Social History Social History   Tobacco Use   Smoking status: Never   Smokeless tobacco: Never  Vaping Use   Vaping Use: Never used  Substance Use Topics   Alcohol use: No   Drug use: No     Allergies   Codeine   Review of Systems Review of Systems  Constitutional: Negative.   Respiratory: Negative.    Gastrointestinal:  Positive for abdominal pain. Negative for abdominal distention, anal bleeding, blood in stool, constipation, diarrhea, nausea, rectal pain and vomiting.  Skin: Negative.   Neurological: Negative.     Physical Exam Triage Vital Signs ED Triage Vitals  Enc Vitals Group     BP 11/02/21 1358 106/68     Pulse Rate 11/02/21 1358 80     Resp 11/02/21 1358 18     Temp 11/02/21 1358 98.4 F (36.9 C)     Temp Source 11/02/21 1358 Oral     SpO2 11/02/21 1358 99 %     Weight 11/02/21 1355 150 lb (68 kg)     Height 11/02/21 1355 5\' 3"  (1.6 m)     Head Circumference --      Peak Flow --      Pain Score 11/02/21 1355 0     Pain Loc --      Pain Edu? --      Excl. in [place]? --    No data found.  Updated Vital Signs BP 106/68 (BP Location: Left Arm)    Pulse 80    Temp 98.4 F (36.9 C) (Oral)    Resp 18    Ht 5\' 3"  (1.6 m)    Wt 150 lb (68 kg)    LMP 11/18/2014    SpO2 99%    BMI 26.57 kg/m   Visual Acuity Right Eye Distance:   Left Eye Distance:   Bilateral Distance:    Right Eye Near:   Left Eye Near:    Bilateral Near:     Physical Exam Constitutional:      Appearance: Normal appearance.  Eyes:     Extraocular Movements: Extraocular movements intact.  Pulmonary:     Effort: Pulmonary effort is normal.  Abdominal:     General: Abdomen is flat. Bowel sounds are normal.     Palpations: Abdomen is soft.     Tenderness: There is abdominal tenderness in the suprapubic area.  Skin:    General: Skin is warm and dry.   Neurological:     Mental Status: She is alert and oriented to person, place, and time. Mental status is at baseline.  Psychiatric:        Mood and Affect: Mood normal.        Behavior: Behavior normal.     UC Treatments / Results  Labs (all labs ordered are listed, but only abnormal results are displayed) Labs Reviewed  URINALYSIS, ROUTINE W REFLEX MICROSCOPIC - Abnormal; Notable for the following components:      Result Value   Ketones, ur TRACE (*)    All other components within normal limits  WET PREP, GENITAL    EKG   Radiology No results found.  Procedures Procedures (including critical care time)  Medications Ordered in UC Medications - No data to display  Initial Impression / Assessment and Plan / UC Course  I have reviewed the triage vital signs and the nursing notes.  Pertinent labs & imaging results that were available during my care of the patient were reviewed by me and considered in my medical decision making (see chart for details).  Suprapubic pressure  Urinalysis negative, wet prep negative, unknown etiology of symptoms, this was noted in the suprapubic region on exam, no palpable masses noted, discussed with patient, recommended consistent use of NSAIDs to help with discomfort as patient has already used Uristat which did provide some relief, recommended follow-up with gynecology for outpatient imaging and further work-up Final Clinical Impressions(s) / UC Diagnoses   Final diagnoses:  None   Discharge Instructions   None    ED Prescriptions   None    PDMP not reviewed this encounter.   Hans Eden, NP 11/03/21 1011

## 2021-11-02 NOTE — Telephone Encounter (Signed)
Pt is still having pressure. She feels it all the time. Urine culture showed no growth. Pain in both sides; noticed this more yesterday afternoon. It's not constant. No fever. I spoke with JAG. No available appts here today; may want to go to Urgent Care for evaluation. Pt voiced understanding. [place]

## 2021-11-02 NOTE — ED Triage Notes (Signed)
Pt c/o pressure pushing down on her abdomen, Urinary frequency, and side pain. Pt states that she was seen at [facility] in [place] and had a urinalysis and it was negative.   Pt states that the sensation feels like shes about to start her cycle.

## 2021-11-02 NOTE — Discharge Instructions (Addendum)
Urinalysis is negative for infection Vaginal swab was negative for infection  Unfortunately today I do not know the cause of your symptoms, I recommend that you follow-up with your gynecologist as soon as possible for evaluation of imaging  You may attempt use of ibuprofen 800 mg every 8 hours to help calm symptoms further until you can be reevaluated  If the lower pressure begins to become severe pain pain I would recommend going to the nearest emergency department for further evaluation

## 2021-11-05 ENCOUNTER — Telehealth: Payer: Self-pay | Admitting: Adult Health

## 2021-11-05 NOTE — Telephone Encounter (Signed)
Pt needs to be seen. Call transferred to [facility] for appt. [place]

## 2021-11-05 NOTE — Telephone Encounter (Signed)
Pt went to Urgent Care Friday. Everything was normal. Pt urinates; she feels relief but when she sits back down, she feels pressure. I advised she may need to schedule an appt with you but she wanted me to send this message to you first. Thanks!! [place]

## 2021-11-05 NOTE — Telephone Encounter (Signed)
Patient called stating that she would like a call back from [place], patient did not state the reason for the call. Please contact pt

## 2021-11-06 ENCOUNTER — Encounter: Payer: Self-pay | Admitting: Obstetrics & Gynecology

## 2021-11-06 ENCOUNTER — Ambulatory Visit: Payer: BC Managed Care – PPO | Admitting: Obstetrics & Gynecology

## 2021-11-06 ENCOUNTER — Other Ambulatory Visit: Payer: Self-pay

## 2021-11-06 VITALS — BP 120/76 | HR 81

## 2021-11-06 DIAGNOSIS — R399 Unspecified symptoms and signs involving the genitourinary system: Secondary | ICD-10-CM | POA: Diagnosis not present

## 2021-11-06 LAB — POCT URINALYSIS DIPSTICK OB
Blood, UA: NEGATIVE
Glucose, UA: NEGATIVE
Ketones, UA: NEGATIVE
Leukocytes, UA: NEGATIVE
Nitrite, UA: NEGATIVE
POC,PROTEIN,UA: NEGATIVE

## 2021-11-06 NOTE — Progress Notes (Signed)
° °  GYN VISIT Patient name: Karen Mays MRN [id number]  Date of birth: January 22, 1961 Chief Complaint:   Urinary problems  History of Present Illness:   Karen Mays is a 61 y.o. G1P1001 PM female being seen today for urinary issues:  -Started two Fridays ago- notes pelvic pressure and discomfort though today she notes it is a lot better.  She also notes annoying/nagging pain bilateral groins.  Took ibuprofen and symptoms improved.  Records reviewed- 11/02/2021- UA and culture negative.  Wet prep also negative.  Tried OTC treatment, which has helped.  Denies dysuria, notes slight burning.  Denies hematuria.  Denies urinary frequency.  Denies incontinence.  Denies vaginal bleeding, discharge, itching or odor.  Denies pelvic or abdominal pain.   Patient's last menstrual period was 11/18/2014.  Depression screen [facility] 2/9 12/08/2020 03/28/2017 03/28/2017  Decreased Interest 0 0 0  Down, Depressed, Hopeless 0 1 1  PHQ - 2 Score 0 1 1  Altered sleeping 0 1 -  Tired, decreased energy 0 0 -  Change in appetite 0 1 -  Feeling bad or failure about yourself  0 0 -  Trouble concentrating 0 0 -  Moving slowly or fidgety/restless 0 0 -  Suicidal thoughts 0 0 -  PHQ-9 Score 0 3 -     Review of Systems:   Pertinent items are noted in HPI Denies fever/chills, dizziness, headaches, visual disturbances, fatigue, shortness of breath, chest pain, abdominal pain, vomiting or intercourse unless otherwise stated above.  Pertinent History Reviewed:  Reviewed past medical,surgical, social, obstetrical and family history.  Reviewed problem list, medications and allergies. Physical Assessment:   Vitals:   11/06/21 1532  BP: 120/76  Pulse: 81  There is no height or weight on file to calculate BMI.       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   Janyth Pupa, DO Attending [place], [facility] for [employer], [place]

## 2021-12-07 ENCOUNTER — Other Ambulatory Visit: Payer: Self-pay | Admitting: Internal Medicine

## 2021-12-07 ENCOUNTER — Other Ambulatory Visit (HOSPITAL_COMMUNITY): Payer: Self-pay | Admitting: Internal Medicine

## 2021-12-07 DIAGNOSIS — E538 Deficiency of other specified B group vitamins: Secondary | ICD-10-CM | POA: Diagnosis not present

## 2021-12-07 DIAGNOSIS — F410 Panic disorder [episodic paroxysmal anxiety] without agoraphobia: Secondary | ICD-10-CM | POA: Diagnosis not present

## 2021-12-07 DIAGNOSIS — R7309 Other abnormal glucose: Secondary | ICD-10-CM | POA: Diagnosis not present

## 2021-12-07 DIAGNOSIS — E010 Iodine-deficiency related diffuse (endemic) goiter: Secondary | ICD-10-CM

## 2021-12-07 DIAGNOSIS — Z0001 Encounter for general adult medical examination with abnormal findings: Secondary | ICD-10-CM | POA: Diagnosis not present

## 2021-12-07 DIAGNOSIS — E559 Vitamin D deficiency, unspecified: Secondary | ICD-10-CM | POA: Diagnosis not present

## 2021-12-07 DIAGNOSIS — E663 Overweight: Secondary | ICD-10-CM | POA: Diagnosis not present

## 2021-12-07 DIAGNOSIS — Z6826 Body mass index (BMI) 26.0-26.9, adult: Secondary | ICD-10-CM | POA: Diagnosis not present

## 2021-12-07 DIAGNOSIS — Z1331 Encounter for screening for depression: Secondary | ICD-10-CM | POA: Diagnosis not present

## 2021-12-07 LAB — TSH: TSH: 2.95 (ref 0.41–5.90)

## 2021-12-10 ENCOUNTER — Other Ambulatory Visit: Payer: Self-pay

## 2021-12-10 DIAGNOSIS — Z1211 Encounter for screening for malignant neoplasm of colon: Secondary | ICD-10-CM

## 2021-12-10 MED ORDER — NA SULFATE-K SULFATE-MG SULF 17.5-3.13-1.6 GM/177ML PO SOLN: 1.0000 | Freq: Once | ORAL | 0 refills | Status: AC

## 2021-12-10 NOTE — Progress Notes (Signed)
Gastroenterology Pre-Procedure Review ? ?Request Date: 12/24/2021 ?Requesting Physician: Dr. Allen Norris ? ?PATIENT REVIEW QUESTIONS: The patient responded to the following health history questions as indicated:   ? ?1. Are you having any GI issues? no ?2. Do you have a personal history of Polyps? no ?3. Do you have a family history of Colon Cancer or Polyps? no ?4. Diabetes Mellitus? no ?5. Joint replacements in the past 12 months?no ?6. Major health problems in the past 3 months?no ?7. Any artificial heart valves, MVP, or defibrillator?no ?   ?MEDICATIONS & ALLERGIES:    ?Patient reports the following regarding taking any anticoagulation/antiplatelet therapy:   ?Plavix, Coumadin, Eliquis, Xarelto, Lovenox, Pradaxa, Brilinta, or Effient? no ?Aspirin? no ? ?Patient confirms/reports the following medications:  ?Current Outpatient Medications  ?Medication Sig Dispense Refill  ? cetirizine (ZYRTEC) 10 MG tablet Take by mouth.    ? NON FORMULARY X-factor vitamins ?Bio Cleanse ?Vita Biome ?Pro Bio5 ?MegaX    ? omeprazole (PRILOSEC) 20 MG capsule Take 20 mg by mouth daily.    ? ?No current facility-administered medications for this visit.  ? ? ?Patient confirms/reports the following allergies:  ?Allergies  ?Allergen Reactions  ? Codeine Palpitations  ? ? ?No orders of the defined types were placed in this encounter. ? ? ?AUTHORIZATION INFORMATION ?Primary Insurance: ?1D#: ?Group #: ? ?Secondary Insurance: ?1D#: ?Group #: ? ?SCHEDULE INFORMATION: ?Date: 12/24/2021 ?Time: ?Location:[facility] ? ?

## 2021-12-14 ENCOUNTER — Ambulatory Visit
Admission: RE | Admit: 2021-12-14 | Discharge: 2021-12-14 | Disposition: A | Discharge status: Home / Self Care | Payer: BC Managed Care – PPO | Source: Ambulatory Visit | Attending: Internal Medicine | Admitting: Internal Medicine

## 2021-12-14 ENCOUNTER — Other Ambulatory Visit: Payer: Self-pay

## 2021-12-14 DIAGNOSIS — E010 Iodine-deficiency related diffuse (endemic) goiter: Secondary | ICD-10-CM | POA: Diagnosis not present

## 2021-12-14 DIAGNOSIS — E041 Nontoxic single thyroid nodule: Secondary | ICD-10-CM | POA: Diagnosis not present

## 2021-12-24 ENCOUNTER — Ambulatory Visit: Payer: BC Managed Care – PPO | Admitting: Anesthesiology

## 2021-12-24 ENCOUNTER — Other Ambulatory Visit: Payer: Self-pay

## 2021-12-24 ENCOUNTER — Encounter
Admission: RE | Disposition: A | Discharge status: Home / Self Care | Payer: Self-pay | Source: Home / Self Care | Attending: Gastroenterology

## 2021-12-24 ENCOUNTER — Encounter: Payer: Self-pay | Admitting: Gastroenterology

## 2021-12-24 ENCOUNTER — Ambulatory Visit
Admission: RE | Admit: 2021-12-24 | Discharge: 2021-12-24 | Disposition: A | Discharge status: Home / Self Care | Payer: BC Managed Care – PPO | Attending: Gastroenterology | Admitting: Gastroenterology

## 2021-12-24 DIAGNOSIS — D125 Benign neoplasm of sigmoid colon: Secondary | ICD-10-CM | POA: Insufficient documentation

## 2021-12-24 DIAGNOSIS — K648 Other hemorrhoids: Secondary | ICD-10-CM | POA: Diagnosis not present

## 2021-12-24 DIAGNOSIS — D126 Benign neoplasm of colon, unspecified: Secondary | ICD-10-CM | POA: Diagnosis not present

## 2021-12-24 DIAGNOSIS — K449 Diaphragmatic hernia without obstruction or gangrene: Secondary | ICD-10-CM | POA: Diagnosis not present

## 2021-12-24 DIAGNOSIS — Z1211 Encounter for screening for malignant neoplasm of colon: Secondary | ICD-10-CM | POA: Diagnosis not present

## 2021-12-24 DIAGNOSIS — K635 Polyp of colon: Secondary | ICD-10-CM | POA: Diagnosis not present

## 2021-12-24 HISTORY — PX: POLYPECTOMY: SHX5525

## 2021-12-24 HISTORY — PX: COLONOSCOPY WITH PROPOFOL: SHX5780

## 2021-12-24 SURGERY — COLONOSCOPY WITH PROPOFOL
Anesthesia: General | Site: Rectum

## 2021-12-24 MED ORDER — LIDOCAINE HCL (CARDIAC) PF 100 MG/5ML IV SOSY
PREFILLED_SYRINGE | INTRAVENOUS | Status: DC | PRN
  Administered 2021-12-24: 50 mg via INTRAVENOUS

## 2021-12-24 MED ORDER — LACTATED RINGERS IV SOLN: INTRAVENOUS | Status: DC

## 2021-12-24 MED ORDER — STERILE WATER FOR IRRIGATION IR SOLN
Status: DC | PRN
  Administered 2021-12-24: 250 mL

## 2021-12-24 MED ORDER — OXYCODONE HCL 5 MG/5ML PO SOLN: 5.0000 mg | Freq: Once | ORAL | Status: DC | PRN

## 2021-12-24 MED ORDER — SODIUM CHLORIDE 0.9 % IV SOLN: INTRAVENOUS | Status: DC

## 2021-12-24 MED ORDER — OXYCODONE HCL 5 MG PO TABS: 5.0000 mg | ORAL_TABLET | Freq: Once | ORAL | Status: DC | PRN

## 2021-12-24 MED ORDER — PROPOFOL 10 MG/ML IV BOLUS
INTRAVENOUS | Status: DC | PRN
  Administered 2021-12-24 (×3): 20 mg via INTRAVENOUS
  Administered 2021-12-24: 100 mg via INTRAVENOUS
  Administered 2021-12-24: 50 mg via INTRAVENOUS

## 2021-12-24 SURGICAL SUPPLY — 6 items
GOWN CVR UNV OPN BCK APRN NK (MISCELLANEOUS) ×6 IMPLANT
GOWN ISOL THUMB LOOP REG UNIV (MISCELLANEOUS) ×6
KIT PRC NS LF DISP ENDO (KITS) ×3 IMPLANT
KIT PROCEDURE OLYMPUS (KITS) ×3
MANIFOLD NEPTUNE II (INSTRUMENTS) ×4 IMPLANT
WATER STERILE IRR 250ML POUR (IV SOLUTION) ×4 IMPLANT

## 2021-12-24 NOTE — Transfer of Care (Signed)
Immediate Anesthesia Transfer of Care Note ? ?Patient: Karen Mays ? ?Procedure(s) Performed: COLONOSCOPY WITH PROPOFOL (Rectum) ?POLYPECTOMY (Rectum) ? ?Patient Location: PACU ? ?Anesthesia Type: General ? ?Level of Consciousness: awake, alert  and patient cooperative ? ?Airway and Oxygen Therapy: Patient Spontanous Breathing and Patient connected to supplemental oxygen ? ?Post-op Assessment: Post-op Vital signs reviewed, Patient's Cardiovascular Status Stable, Respiratory Function Stable, Patent Airway and No signs of Nausea or vomiting ? ?Post-op Vital Signs: Reviewed and stable ? ?Complications: No notable events documented. ? ?

## 2021-12-24 NOTE — Anesthesia Postprocedure Evaluation (Signed)
Anesthesia Post Note ? ?Patient: Karen Mays ? ?Procedure(s) Performed: COLONOSCOPY WITH PROPOFOL (Rectum) ?POLYPECTOMY (Rectum) ? ? ?  ?Patient location during evaluation: PACU ?Anesthesia Type: General ?Level of consciousness: awake and alert ?Pain management: pain level controlled ?Vital Signs Assessment: post-procedure vital signs reviewed and stable ?Respiratory status: spontaneous breathing, nonlabored ventilation, respiratory function stable and patient connected to nasal cannula oxygen ?Cardiovascular status: blood pressure returned to baseline and stable ?Postop Assessment: no apparent nausea or vomiting ?Anesthetic complications: no ? ? ?No notable events documented. ? ?Fidel Levy ? ? ? ? ? ?

## 2021-12-24 NOTE — Anesthesia Procedure Notes (Signed)
Date/Time: 12/24/2021 10:46 AM ?Performed by: Cameron Ali, CRNA ?Pre-anesthesia Checklist: Patient identified, Emergency Drugs available, Suction available, Timeout performed and Patient being monitored ?Patient Re-evaluated:Patient Re-evaluated prior to induction ?Oxygen Delivery Method: Nasal cannula ?Placement Confirmation: positive ETCO2 ? ? ? ? ?

## 2021-12-24 NOTE — Op Note (Signed)
[facility] ?Gastroenterology ?Patient Name: Karen Mays ?Procedure Date: 12/24/2021 10:39 AM ?MRN: [id number] ?Account #: [id number] ?Date of Birth: 03-Jan-1961 ?Admit Type: Outpatient ?Age: 61 ?Room: [facility] OR ROOM 01 ?Gender: Female ?Note Status: Finalized ?Instrument Name: [id number] ?Procedure:             Colonoscopy ?Indications:           Screening for colorectal malignant neoplasm ?Providers:             Lucilla Lame MD, MD ?Referring MD:          Redmond School, MD (Referring MD) ?Medicines:             Propofol per Anesthesia ?Complications:         No immediate complications. ?Procedure:             Pre-Anesthesia Assessment: ?                       - Prior to the procedure, a History and Physical was  ?                       performed, and patient medications and allergies were  ?                       reviewed. The patient's tolerance of previous  ?                       anesthesia was also reviewed. The risks and benefits  ?                       of the procedure and the sedation options and risks  ?                       were discussed with the patient. All questions were  ?                       answered, and informed consent was obtained. Prior  ?                       Anticoagulants: The patient has taken no previous  ?                       anticoagulant or antiplatelet agents. ASA Grade  ?                       Assessment: II - A patient with mild systemic disease.  ?                       After reviewing the risks and benefits, the patient  ?                       was deemed in satisfactory condition to undergo the  ?                       procedure. ?                       After obtaining informed consent, the colonoscope was  ?  passed under direct vision. Throughout the procedure,  ?                       the patient's blood pressure, pulse, and oxygen  ?                       saturations were monitored continuously. The  ?                       Colonoscope  was introduced through the anus and  ?                       advanced to the the cecum, identified by appendiceal  ?                       orifice and ileocecal valve. The colonoscopy was  ?                       performed without difficulty. The patient tolerated  ?                       the procedure well. The quality of the bowel  ?                       preparation was excellent. ?Findings: ?     The perianal and digital rectal examinations were normal. ?     A 8 mm polyp was found in the sigmoid colon. The polyp was pedunculated.  ?     The polyp was removed with a cold snare. Resection and retrieval were  ?     complete. ?     Non-bleeding internal hemorrhoids were found during retroflexion. The  ?     hemorrhoids were Grade I (internal hemorrhoids that do not prolapse). ?Impression:            - One 8 mm polyp in the sigmoid colon, removed with a  ?                       cold snare. Resected and retrieved. ?                       - Non-bleeding internal hemorrhoids. ?Recommendation:        - Discharge patient to home. ?                       - Resume previous diet. ?                       - Continue present medications. ?                       - Await pathology results. ?                       - If the pathology report reveals adenomatous tissue,  ?                       then repeat the colonoscopy for surveillance in 7  ?                       years  otherwise 10 years. ?Procedure Code(s):     --- Professional --- ?                       [phone], Colonoscopy, flexible; with removal of  ?                       tumor(s), polyp(s), or other lesion(s) by snare  ?                       technique ?Diagnosis Code(s):     --- Professional --- ?                       Z12.11, Encounter for screening for malignant neoplasm  ?                       of colon ?                       K63.5, Polyp of colon ?CPT copyright 2019 [employer] Medical Association. All rights reserved. ?The codes documented in this report are  preliminary and upon coder review may  ?be revised to meet current compliance requirements. ?Lucilla Lame MD, MD ?12/24/2021 11:06:56 AM ?This report has been signed electronically. ?Number of Addenda: 0 ?Note Initiated On: 12/24/2021 10:39 AM ?Scope Withdrawal Time: 0 hours 9 minutes 35 seconds  ?Total Procedure Duration: 0 hours 15 minutes 22 seconds  ?Estimated Blood Loss:  Estimated blood loss: none. ?     [facility] ?

## 2021-12-24 NOTE — H&P (Signed)
? ?Lucilla Lame, MD [facility] ?[address] ?[place], [place] [address] ?Phone: [phone] ?Fax : [phone] ? ?Primary Care Physician:  Redmond School, MD ?Primary Gastroenterologist:  Dr. Allen Norris ? ?Pre-Procedure History & Physical: ?HPI:  Karen Mays is a 61 y.o. female is here for a screening colonoscopy.  ? ?Past Medical History:  ?Diagnosis Date  ? Acid reflux 11/24/2014  ? Contraceptive management 11/22/2013  ? Dyslipidemia 04/01/2017  ? Elevated cholesterol   ? GERD (gastroesophageal reflux disease)   ? Melanoma ([place])   ? t2a  ? Vertigo   ? ? ?Past Surgical History:  ?Procedure Laterality Date  ? excision of melanoma    ? ? ?Prior to Admission medications   ?Medication Sig Start Date End Date Taking? Authorizing Provider  ?cetirizine (ZYRTEC) 10 MG tablet Take by mouth.   Yes [provider]  ?NON FORMULARY X-factor vitamins ?Bio Cleanse ?Vita Biome ?Pro Bio5 ?[place]   Yes [provider]  ?omeprazole (PRILOSEC) 20 MG capsule Take 20 mg by mouth daily.   Yes [provider]  ? ? ?Allergies as of 12/10/2021 - Review Complete 12/10/2021  ?Allergen Reaction Noted  ? Codeine Palpitations 11/22/2013  ? ? ?Family History  ?Problem Relation Age of Onset  ? Hyperlipidemia Mother   ? Heart disease Mother   ? Diabetes Mother   ? Heart disease Father   ? Hyperlipidemia Father   ? Hypertension Father   ? Diabetes Father   ? Other Father   ?     has a pacemaker  ? Diabetes Sister   ?     borderline  ? Cancer Paternal Aunt   ?     stomach  ? Diabetes Paternal Uncle   ? Hypertension Paternal Uncle   ? Cancer Paternal Aunt   ?     brain tumor  ? Diabetes Paternal Aunt   ? Hypertension Paternal Aunt   ? Cancer Paternal Aunt   ?     ovarian  ? Diabetes Paternal Aunt   ? Hypertension Paternal Aunt   ? Cancer Cousin 40  ?     ovarian  ? Hypertension Paternal Uncle   ? Diabetes Paternal Uncle   ? Cancer Maternal Grandmother   ?     lung  ? Cancer Maternal Grandfather   ?     lung  ? Heart attack  Paternal Grandmother   ? Other Paternal Grandfather   ?     aneursym  ? Diabetes Paternal Aunt   ? Hypertension Paternal Aunt   ? Diabetes Paternal Aunt   ? Hypertension Paternal Aunt   ? Diabetes Paternal Aunt   ? Hypertension Paternal Aunt   ? Diabetes Paternal Aunt   ? Hypertension Paternal Aunt   ? Diabetes Paternal Uncle   ? Hypertension Paternal Uncle   ? Diabetes Paternal Uncle   ? Hypertension Paternal Uncle   ? Diabetes Paternal Uncle   ? Hypertension Paternal Uncle   ? Diabetes Paternal Uncle   ? Hypertension Paternal Uncle   ? Diabetes Paternal Uncle   ? Hypertension Paternal Uncle   ? ? ?Social History  ? ?Socioeconomic History  ? Marital status: Married  ?  Spouse name: Not on file  ? Number of children: Not on file  ? Years of education: Not on file  ? Highest education level: Not on file  ?Occupational History  ? Not on file  ?Tobacco Use  ? Smoking status: Never  ? Smokeless tobacco: Never  ?  Vaping Use  ? Vaping Use: Never used  ?Substance and Sexual Activity  ? Alcohol use: No  ? Drug use: No  ? Sexual activity: Yes  ?  Birth control/protection: None, Post-menopausal  ?Other Topics Concern  ? Not on file  ?Social History Narrative  ? Not on file  ? ?Social Determinants of Health  ? ?Financial Resource Strain: Not on file  ?Food Insecurity: Not on file  ?Transportation Needs: Not on file  ?Physical Activity: Not on file  ?Stress: Not on file  ?Social Connections: Not on file  ?Intimate Partner Violence: Not on file  ? ? ?Review of Systems: ?See HPI, otherwise negative ROS ? ?Physical Exam: ?BP 109/70   Pulse 90   Temp 98 ?F (36.7 ?C) (Temporal)   Ht '5\' 3"'$  (1.6 m)   Wt 71.7 kg   LMP 11/18/2014   SpO2 97%   BMI 27.99 kg/m?  ?General:   Alert,  pleasant and cooperative in NAD ?Head:  Normocephalic and atraumatic. ?Neck:  Supple; no masses or thyromegaly. ?Lungs:  Clear throughout to auscultation.    ?Heart:  Regular rate and rhythm. ?Abdomen:  Soft, nontender and nondistended. Normal bowel  sounds, without guarding, and without rebound.   ?Neurologic:  Alert and  oriented x4;  grossly normal neurologically. ? ?Impression/Plan: ?Karen Mays is now here to undergo a screening colonoscopy. ? ?Risks, benefits, and alternatives regarding colonoscopy have been reviewed with the patient.  Questions have been answered.  All parties agreeable. ?

## 2021-12-24 NOTE — Anesthesia Preprocedure Evaluation (Signed)
Anesthesia Evaluation  ?Patient identified by MRN, date of birth, ID band ?Patient awake ? ? ? ?Reviewed: ?NPO status  ? ?History of Anesthesia Complications ?Negative for: history of anesthetic complications ? ?Airway ?Mallampati: II ? ?TM Distance: >3 FB ?Neck ROM: full ? ? ? Dental ?no notable dental hx. ? ?  ?Pulmonary ?neg pulmonary ROS,  ?  ?Pulmonary exam normal ? ? ? ? ? ? ? Cardiovascular ?Exercise Tolerance: Good ?negative cardio ROS ?Normal cardiovascular exam ? ? ?  ?Neuro/Psych ?vertigo ?negative psych ROS  ? GI/Hepatic ?Neg liver ROS, hiatal hernia, Controlled,  ?Endo/Other  ?negative endocrine ROS ? Renal/GU ?negative Renal ROS  ?negative genitourinary ?  ?Musculoskeletal ? ? Abdominal ?  ?Peds ? Hematology ?Melanoma Leg : 2021   ?Anesthesia Other Findings ? ? Reproductive/Obstetrics ? ?  ? ? ? ? ? ? ? ? ? ? ? ? ? ?  ?  ? ? ? ? ? ? ? ? ?Anesthesia Physical ?Anesthesia Plan ? ?ASA: 2 ? ?Anesthesia Plan: General  ? ?Post-op Pain Management:   ? ?Induction:  ? ?PONV Risk Score and Plan: 3 and TIVA, Propofol infusion and Ondansetron ? ?Airway Management Planned:  ? ?Additional Equipment:  ? ?Intra-op Plan:  ? ?Post-operative Plan:  ? ?Informed Consent: I have reviewed the patients History and Physical, chart, labs and discussed the procedure including the risks, benefits and alternatives for the proposed anesthesia with the patient or authorized representative who has indicated his/her understanding and acceptance.  ? ? ? ? ? ?Plan Discussed with: CRNA ? ?Anesthesia Plan Comments:   ? ? ? ? ? ? ?Anesthesia Quick Evaluation ? ?

## 2021-12-25 ENCOUNTER — Encounter: Payer: Self-pay | Admitting: Gastroenterology

## 2021-12-25 LAB — SURGICAL PATHOLOGY

## 2022-04-19 DIAGNOSIS — D225 Melanocytic nevi of trunk: Secondary | ICD-10-CM | POA: Diagnosis not present

## 2022-04-19 DIAGNOSIS — Z1283 Encounter for screening for malignant neoplasm of skin: Secondary | ICD-10-CM | POA: Diagnosis not present

## 2022-04-19 DIAGNOSIS — Z08 Encounter for follow-up examination after completed treatment for malignant neoplasm: Secondary | ICD-10-CM | POA: Diagnosis not present

## 2022-04-19 DIAGNOSIS — Z85820 Personal history of malignant melanoma of skin: Secondary | ICD-10-CM | POA: Diagnosis not present

## 2022-07-15 DIAGNOSIS — Z6829 Body mass index (BMI) 29.0-29.9, adult: Secondary | ICD-10-CM | POA: Diagnosis not present

## 2022-07-15 DIAGNOSIS — C4372 Malignant melanoma of left lower limb, including hip: Secondary | ICD-10-CM | POA: Diagnosis not present

## 2022-08-02 DIAGNOSIS — H04123 Dry eye syndrome of bilateral lacrimal glands: Secondary | ICD-10-CM | POA: Diagnosis not present

## 2022-08-02 DIAGNOSIS — H40033 Anatomical narrow angle, bilateral: Secondary | ICD-10-CM | POA: Diagnosis not present

## 2022-08-16 IMAGING — US US THYROID
1 series · 13 of 25 positions shown · non-contrast
Comparison: None.

CLINICAL DATA: Thyromegaly

EXAM:
THYROID ULTRASOUND
TECHNIQUE: Ultrasound examination of the thyroid gland and adjacent soft
tissues was performed.

[Series 1: us thyroid · 0.07mm/px · 13 of 36 slices shown]
[im 1/36]
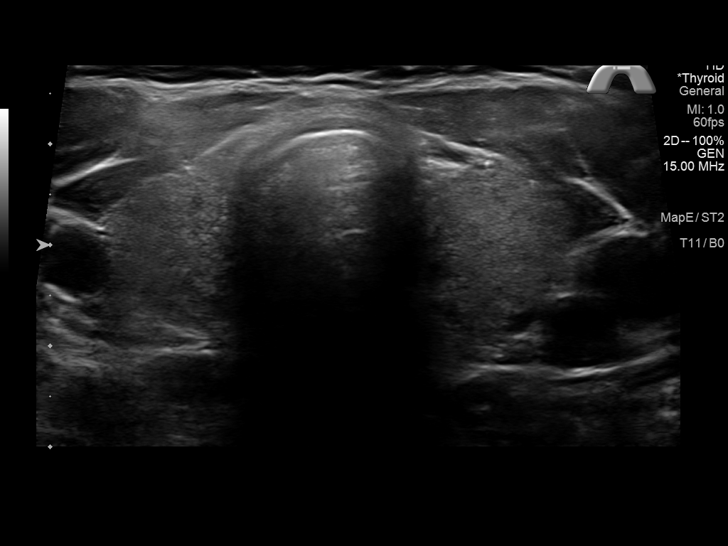
[im 3/36]
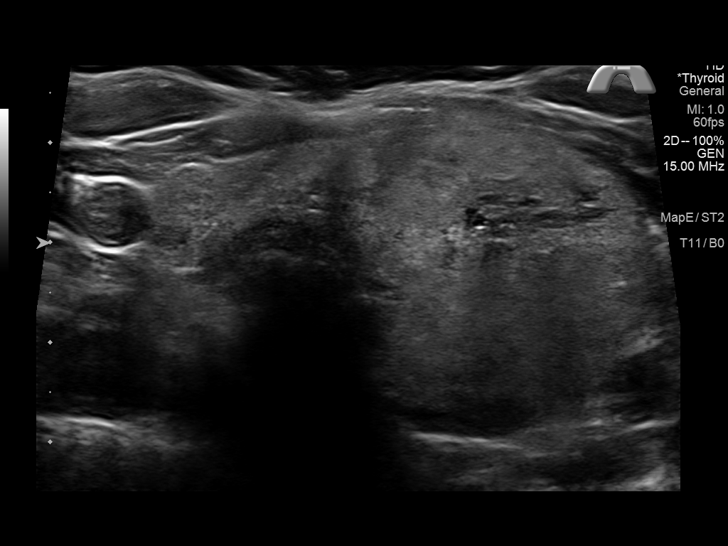
[im 6/36]
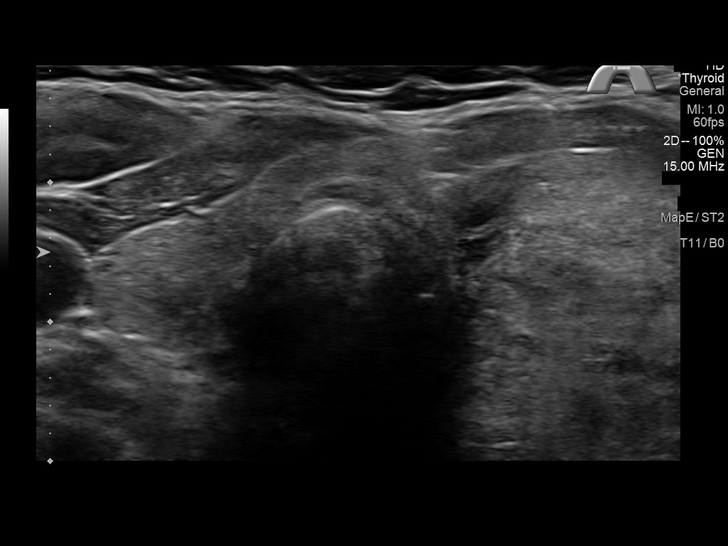
[im 9/36]
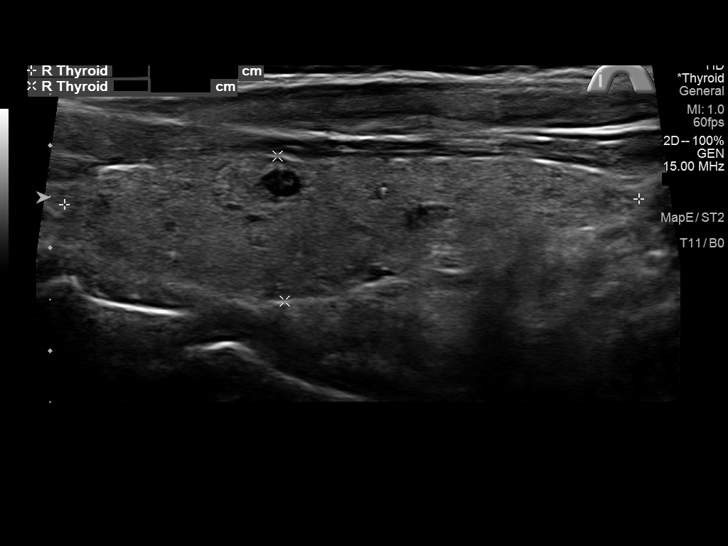
[im 12/36]
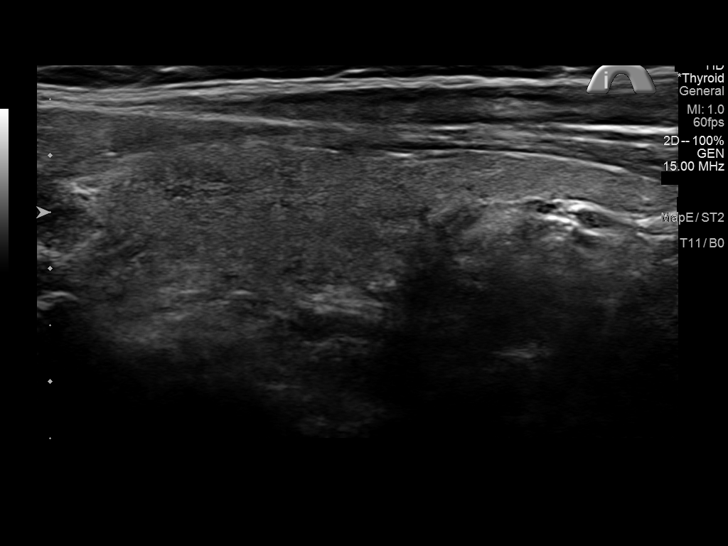
[im 15/36]
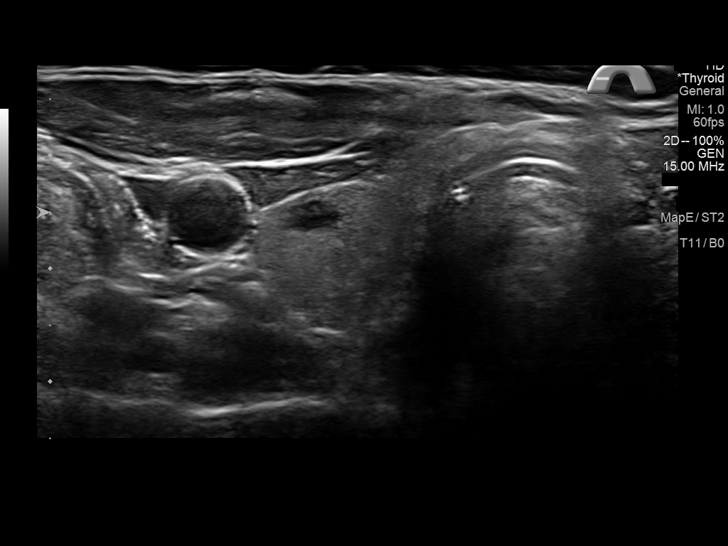
[im 18/36]
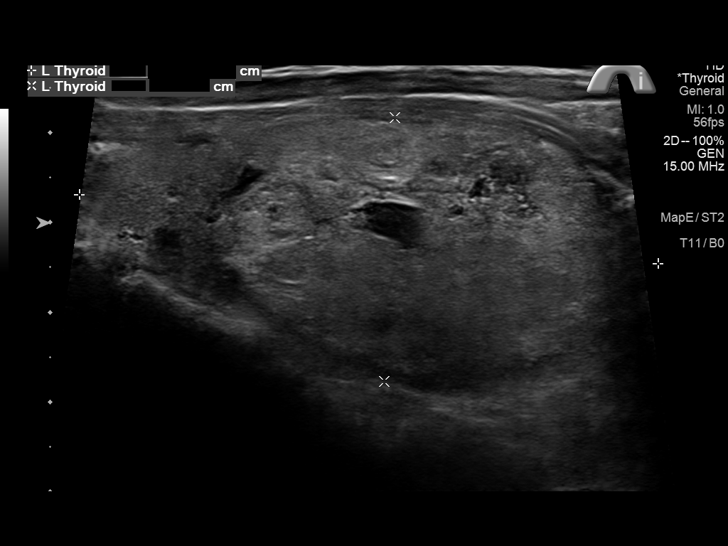
[im 21/36]
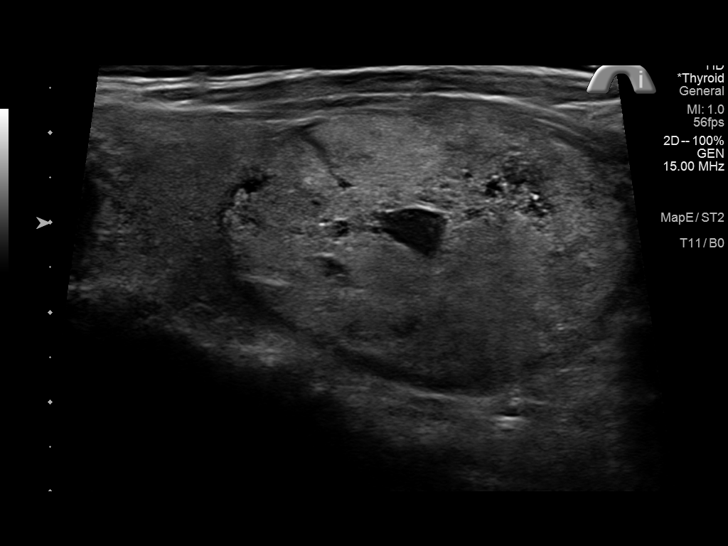
[im 24/36]
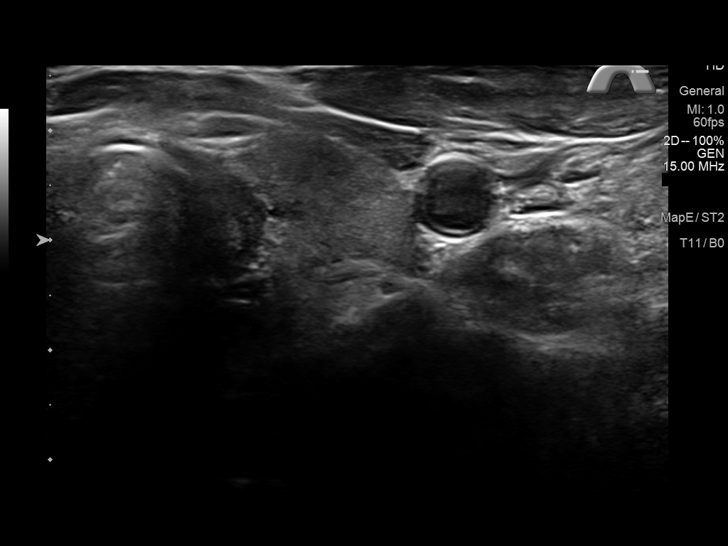
[im 27/36]
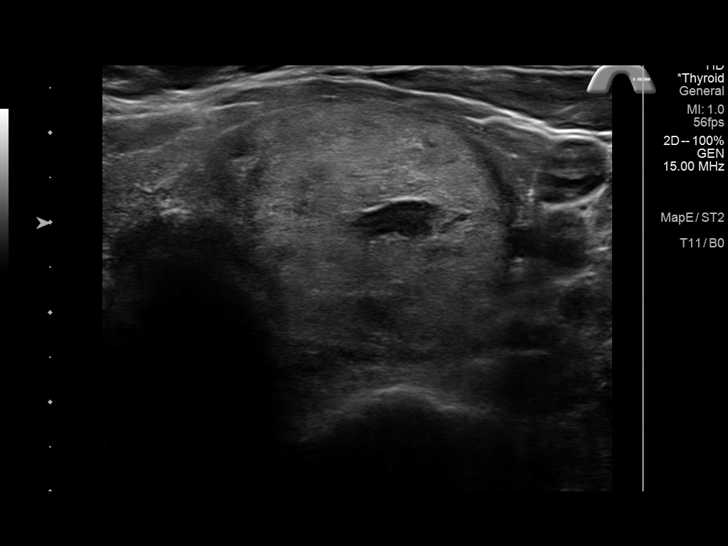
[im 30/36]
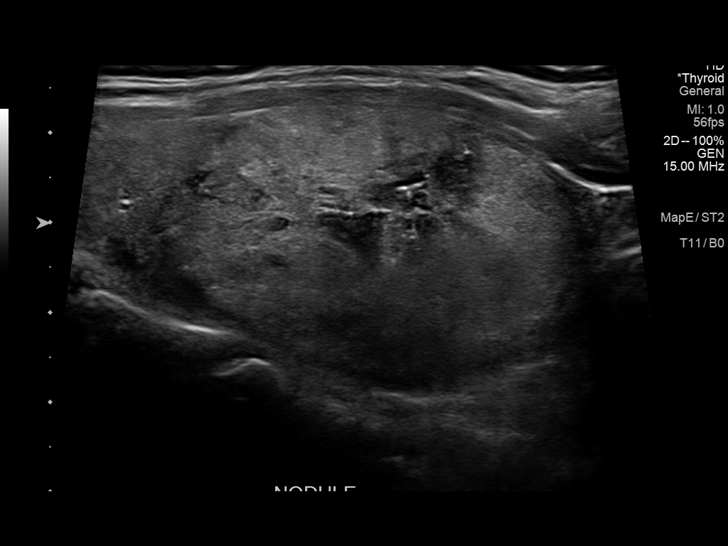
[im 33/36]
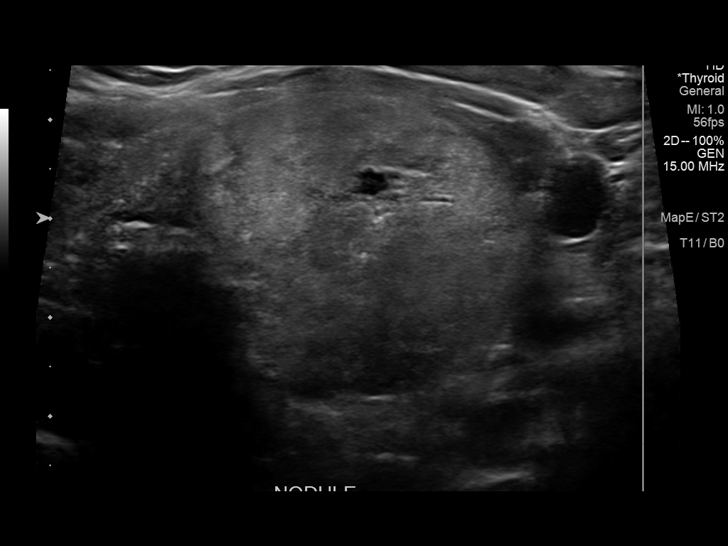
[im 36/36]
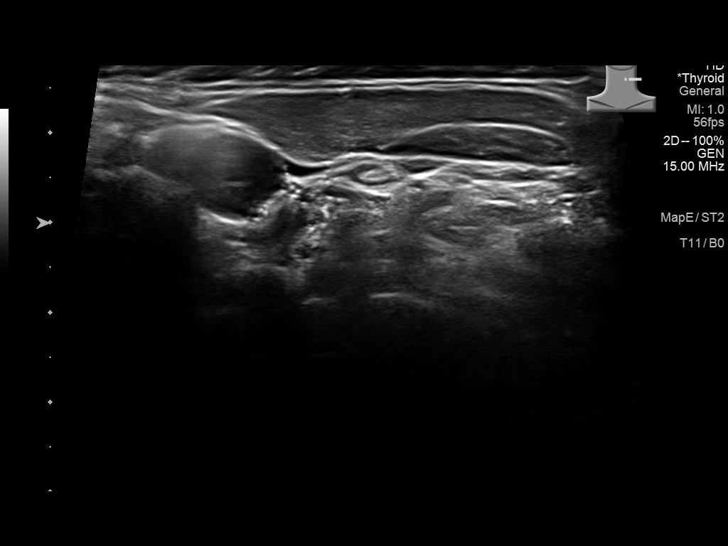

[13 of 25 positions shown; findings below may reference images not displayed]

FINDINGS: Parenchymal Echotexture: Mildly heterogenous

Isthmus: 0.3 cm

Right lobe: 5.6 x 1.4 x 1.6 cm

Left lobe: 6.5 x 2.9 x 3.0 cm

_________________________________________________________

Estimated total number of nodules >/= 1 cm: 1

Number of spongiform nodules >/=  2 cm not described below (TR1): 0

Number of mixed cystic and solid nodules >/= 1.5 cm not described
below (TR2): 0

_________________________________________________________

Multiple less than 5 mm right thyroid nodule does not meet criteria
for FNA or imaging follow-up.

_________________________________________________________

Nodule # 1:

Location: Left; mid

Maximum size: 4.7 cm; Other 2 dimensions: 3.3 x 3.0 cm

Composition: solid/almost completely solid (2)

Echogenicity: isoechoic (1)

Shape: not taller-than-wide (0)

Margins: ill-defined (0)

Echogenic foci: none (0)

ACR TI-RADS total points: 3.

ACR TI-RADS risk category: TR3 (3 points).

ACR TI-RADS recommendations:

**Given size (>/= 2.5 cm) and appearance, fine needle aspiration of
this mildly suspicious nodule should be considered based on TI-RADS
criteria.
IMPRESSION: Nodule 1 (TI-RADS 3), measuring 4.7 cm, located in the mid left
thyroid lobe, meets criteria for FNA.

The above is in keeping with the ACR TI-RADS recommendations - [HOSPITAL] 2513;[DATE].

## 2022-09-05 NOTE — Patient Instructions (Signed)
Thyroid Nodule  A thyroid nodule is an isolated growth of thyroid cells that forms a lump in the thyroid gland. The thyroid gland is a butterfly-shaped gland found in the lower front of the neck. It sends chemical messengers (hormones) through the blood to all parts of the body. These hormones are important in regulating body temperature and helping the body use energy. Thyroid nodules are common. Most are not cancerous (are benign). You may have one nodule or several nodules. There are different types of thyroid nodules. They include nodules that: Grow and fill with fluid (thyroid cysts). Produce too much thyroid hormone (hot nodules or hyperthyroid). Produce no thyroid hormone (cold nodules or hypothyroid). Form from cancer cells (thyroid cancers). What are the causes? In most cases, the cause of thyroid nodules is not known. What increases the risk? The following factors may make you more likely to develop thyroid nodules: Age. Thyroid nodules are more common in people who are older than 45 years. Female gender. A family history that includes: Thyroid nodules. Pheochromocytoma. Thyroid carcinoma. Hyperparathyroidism. Certain thyroid diseases, such as Hashimoto's thyroiditis. Lack of iodine in your diet. A history of head and neck radiation, such as from cancer treatments. Type 2 diabetes. What are the signs or symptoms? In many cases, there are no symptoms. If you have symptoms, they may include: A lump in your lower neck. Feeling pressure, fullness, or a tickle in your throat. Pain in your neck, jaw, or ear. Having trouble swallowing or breathing. Hot nodules may cause: Weight loss. Warm, flushed skin. Feeling hot. Feeling nervous. A rapid or irregular heartbeat. Cold nodules may cause: Weight gain. Dry skin. Hair loss, brittle hair, or both. Feeling cold. Fatigue. Thyroid cancer nodules may cause: Hard nodules that can be felt along the thyroid  gland. Hoarseness. Lumps in the tissue (lymph nodes) near your thyroid gland. How is this diagnosed? A thyroid nodule may be felt by your health care provider during a physical exam. This condition may also be diagnosed based on your symptoms. You may also have tests, including: Blood tests to check how well your thyroid is working. An ultrasound. This may be done to confirm the diagnosis. A biopsy. This involves taking a sample from the nodule and looking at it under a microscope. A thyroid scan. This test creates an image of the thyroid gland using a radioactive tracer. Imaging tests such as an MRI or CT scan. These may be done if: A nodule is large. A nodule is blocking your airway. Cancer is suspected. How is this treated? Treatment depends on the cause and size of your nodule or nodules. If a nodule is benign, treatment may not be necessary. Your health care provider may monitor the nodule to see if it goes away without treatment. If a nodule continues to grow, is cancerous, or does not go away, treatment may be needed. Treatment may include: Having a cystic nodule drained with a needle. Ablation therapy. In this treatment, alcohol is injected into the area of the nodule to destroy the cells. Ablation with heat may also be used. This is called thermal ablation. Radioactive iodine. In this treatment, radioactive iodine is given as a pill or liquid that you drink. This substance causes the thyroid nodule to shrink. Surgery to remove the nodule or nodules. Part or all of your thyroid gland may also need to be removed. Medicines to treat hyperthyroidism. Follow these instructions at home: Pay attention to any changes in your thyroid nodule or nodules. Take over-the-counter  and prescription medicines only as told by your health care provider. Keep all follow-up visits. This is important. Contact a health care provider if: You have trouble sleeping. You have muscle weakness. You have  significant weight loss without changing your eating habits. You feel nervous. You have trouble swallowing. You have increased swelling. You have a rapid or irregular heartbeat. Get help right away if: You have chest pain. You faint or lose consciousness. Your nodule makes it hard for you to breathe. These symptoms may be an emergency. Get help right away. Call 911. Do not wait to see if the symptoms will go away. Do not drive yourself to the hospital. Summary A thyroid nodule is an isolated growth of thyroid cells that forms a lump in your thyroid gland. Thyroid nodules are common. Most are not cancerous. Your health care provider may monitor the nodule to see if it goes away without treatment. If a nodule continues to grow, is cancerous, or does not go away, treatment may be needed. Treatment depends on the cause and size of your nodule or nodules. This information is not intended to replace advice given to you by your health care provider. Make sure you discuss any questions you have with your health care provider. Document Revised: 07/30/2021 Document Reviewed: 07/30/2021 Elsevier Patient Education  [place].

## 2022-09-06 ENCOUNTER — Encounter: Payer: Self-pay | Admitting: Nurse Practitioner

## 2022-09-06 ENCOUNTER — Ambulatory Visit: Payer: BC Managed Care – PPO | Admitting: Nurse Practitioner

## 2022-09-06 VITALS — BP 100/69 | HR 81 | Ht 63.0 in | Wt 166.8 lb

## 2022-09-06 DIAGNOSIS — Z808 Family history of malignant neoplasm of other organs or systems: Secondary | ICD-10-CM

## 2022-09-06 DIAGNOSIS — Z8349 Family history of other endocrine, nutritional and metabolic diseases: Secondary | ICD-10-CM

## 2022-09-06 DIAGNOSIS — E041 Nontoxic single thyroid nodule: Secondary | ICD-10-CM | POA: Diagnosis not present

## 2022-09-06 NOTE — Progress Notes (Signed)
Endocrinology Consult Note 09/06/22    ---------------------------------------------------------------------------------------------------------------------- Subjective    Past Medical History:  Diagnosis Date   Acid reflux 11/24/2014   Contraceptive management 11/22/2013   Dyslipidemia 04/01/2017   Elevated cholesterol    GERD (gastroesophageal reflux disease)    Melanoma ([facility])    t2a   Vertigo     Past Surgical History:  Procedure Laterality Date   COLONOSCOPY WITH PROPOFOL N/A 12/24/2021   Procedure: COLONOSCOPY WITH PROPOFOL;  Surgeon: Lucilla Lame, MD;  Location: [place];  Service: Endoscopy;  Laterality: N/A;   excision of melanoma     POLYPECTOMY  12/24/2021   Procedure: POLYPECTOMY;  Surgeon: Lucilla Lame, MD;  Location: [facility];  Service: Endoscopy;;    Social History   Socioeconomic History   Marital status: Married    Spouse name: Not on file   Number of children: Not on file   Years of education: Not on file   Highest education level: Not on file  Occupational History   Not on file  Tobacco Use   Smoking status: Never   Smokeless tobacco: Never  Vaping Use   Vaping Use: Never used  Substance and Sexual Activity   Alcohol use: No   Drug use: No   Sexual activity: Yes    Birth control/protection: None, Post-menopausal  Other Topics Concern   Not on file  Social History Narrative   Not on file   Social Determinants of Health   Financial Resource Strain: Low Risk  (12/08/2020)   Overall Financial Resource Strain (CARDIA)    Difficulty of Paying Living Expenses: Not hard at all  Food Insecurity: No Food Insecurity (12/08/2020)   Hunger Vital Sign    Worried About Running Out of Food in the Last Year: Never true    Clint in the Last Year: Never true  Transportation Needs: No Transportation Needs (12/08/2020)   PRAPARE - Hydrologist (Medical): No    Lack of Transportation (Non-Medical): No   Physical Activity: Sufficiently Active (12/08/2020)   Exercise Vital Sign    Days of Exercise per Week: 7 days    Minutes of Exercise per Session: 70 min  Stress: No Stress Concern Present (12/08/2020)   [place]    Feeling of Stress : Not at all  Social Connections: Moderately Integrated (12/08/2020)   Social Connection and Isolation Panel [NHANES]    Frequency of Communication with Friends and Family: More than three times a week    Frequency of Social Gatherings with Friends and Family: More than three times a week    Attends Religious Services: 1 to 4 times per year    Active Member of [employer] or Organizations: No    Attends Archivist Meetings: Never    Marital Status: Married  Human resources officer Violence: Not At Risk (12/08/2020)   Humiliation, Afraid, Rape, and Kick questionnaire    Fear of Current or Ex-Partner: No    Emotionally Abused: No    Physically Abused: No    Sexually Abused: No    Current Outpatient Medications on File Prior to Visit  Medication Sig Dispense Refill   cetirizine (ZYRTEC) 10 MG tablet Take by mouth.     ezetimibe (ZETIA) 10 MG tablet Take 10 mg by mouth daily.     NON FORMULARY X-factor vitamins Bio Cleanse Vita Biome Pro Bio5 MegaX     omeprazole (PRILOSEC) 20 MG capsule Take  20 mg by mouth daily.     OVER THE COUNTER MEDICATION Fish, Flax, and Borage Oil- patient takes 1 by mouth daily     VITAMIN D PO Take 2,000 Units by mouth daily.     No current facility-administered medications on file prior to visit.      HPI   Karen Mays is a 61 y.o.-year-old female, referred by her PCP, Dr.Fusco, for evaluation for multinodular goiter.  She just received a call from her PCP to go over her ultrasound done 8 months ago which is why the delay in getting her here and the biopsy done.  Thyroid U/S: 12/14/21 CLINICAL DATA:  Thyromegaly   EXAM: THYROID ULTRASOUND    TECHNIQUE: Ultrasound examination of the thyroid gland and adjacent soft tissues was performed.   COMPARISON:  None.   FINDINGS: Parenchymal Echotexture: Mildly heterogenous   Isthmus: 0.3 cm   Right lobe: 5.6 x 1.4 x 1.6 cm   Left lobe: 6.5 x 2.9 x 3.0 cm   _________________________________________________________   Estimated total number of nodules >/= 1 cm: 1   Number of spongiform nodules >/=  2 cm not described below (TR1): 0   Number of mixed cystic and solid nodules >/= 1.5 cm not described below (TR2): 0   _________________________________________________________   Multiple less than 5 mm right thyroid nodule does not meet criteria for FNA or imaging follow-up.   _________________________________________________________   Nodule # 1:   Location: Left; mid   Maximum size: 4.7 cm; Other 2 dimensions: 3.3 x 3.0 cm   Composition: solid/almost completely solid (2)   Echogenicity: isoechoic (1)   Shape: not taller-than-wide (0)   Margins: ill-defined (0)   Echogenic foci: none (0)   ACR TI-RADS total points: 3.   ACR TI-RADS risk category: TR3 (3 points).   ACR TI-RADS recommendations:   **Given size (>/= 2.5 cm) and appearance, fine needle aspiration of this mildly suspicious nodule should be considered based on TI-RADS criteria.   IMPRESSION: Nodule 1 (TI-RADS 3), measuring 4.7 cm, located in the mid left thyroid lobe, meets criteria for FNA.   The above is in keeping with the ACR TI-RADS recommendations - J Am Coll Radiol 2017;14:587-595.     Electronically Signed   By: Miachel Roux M.D.   On: 12/14/2021 09:27   I reviewed pt's thyroid tests: Lab Results  Component Value Date   TSH 2.95 12/07/2021   TSH 3.770 03/28/2017     Pt c/o: - fatigue - palpitations - anxiety - weight gain  Pt denies - feeling nodules in neck - hoarseness - dysphagia - choking - SOB with lying down  She does have sister with hypothyroidism and a  paternal aunt who had thyroid cancer.  No h/o radiation tx to head or neck.  No seaweed or kelp. No recent contrast studies. No steroid use. No herbal supplements. No Biotin supplements or Hair, Skin and Nails vitamins.  Pt also has a history of melanoma.  Review of systems  Constitutional: + steadily increasing body weight,  current Body mass index is 29.55 kg/m. , + fatigue, no subjective hyperthermia, no subjective hypothermia Eyes: no blurry vision, no xerophthalmia ENT: no sore throat, no nodules palpated in throat, no dysphagia/odynophagia, no hoarseness Cardiovascular: no chest pain, no shortness of breath, + palpitations, no leg swelling Respiratory: no cough, no shortness of breath Gastrointestinal: no nausea/vomiting/diarrhea Musculoskeletal: no muscle/joint aches Skin: no rashes, no hyperemia Neurological: no tremors, no numbness, no tingling, no dizziness Psychiatric:  no depression, + anxiety  ---------------------------------------------------------------------------------------------------------------------- Objective    BP 100/69 (BP Location: Left Arm, Patient Position: Sitting, Cuff Size: Normal)   Pulse 81   Ht '5\' 3"'$  (1.6 m)   Wt 166 lb 12.8 oz (75.7 kg)   LMP 11/18/2014   BMI 29.55 kg/m    BP Readings from Last 3 Encounters:  09/06/22 100/69  12/24/21 90/65  11/06/21 120/76    Wt Readings from Last 3 Encounters:  09/06/22 166 lb 12.8 oz (75.7 kg)  12/24/21 158 lb (71.7 kg)  11/02/21 150 lb (68 kg)     Physical Exam- Limited  Constitutional:  Body mass index is 29.55 kg/m. , not in acute distress, normal state of mind, got tearful when discussing possibility of thyroid cancer Eyes:  EOMI, no exophthalmos Neck: Supple Thyroid: + gross goiter, palpable nodule in left mid lobe Cardiovascular: RRR, no murmurs, rubs, or gallops, no edema Respiratory: Adequate breathing efforts, no crackles, rales, rhonchi, or wheezing Musculoskeletal: no gross  deformities, strength intact in all four extremities, no gross restriction of joint movements Skin:  no rashes, no hyperemia Neurological: no tremor with outstretched hands    ----------------------------------------------------------------------------------------------------------------------  ASSESSMENT / PLAN:  1. Thyroid Nodule  - I reviewed the images of her thyroid ultrasound along with the patient. I pointed out that the dominant nodules are large, this being a risk factor for cancer.  Otherwise, the nodules are: - not hypoechoic - without microcalcifications - without internal blood flow - more wide than tall - well delimited from surrounding tissue  Pt does have a thyroid cancer family history and does not have a personal history of RxTx to head/neck. All these would favor benignity.  - the only way that we can tell exactly if it is cancer or not is by doing a thyroid biopsy (FNA). I explained what the test entails. - We discussed about other options, to wait for another 6 months to a year and see if the nodule grows, and only intervene at that time.   - patient decided to have the FNA done now >> I ordered this.   - I explained that this is not cancer, we can continue to follow her on a yearly basis, and check another ultrasound in another year or 2. - she should let me know if she develops neck compression symptoms, in that case, we might need to do either lobectomy or thyroidectomy  - I did explain that, while thyroid surgery is not a complicated one, it still can have side effects and also she might have a risk of ~25% of becoming hypothyroid after hemithyroidectomy.   I also ordered comprehensive thyroid panel, including antibody testing, to assess for any functional thyroid problems as well given her family history.   Follow Up Plan: Return in about 4 weeks (around 10/04/2022) for Thyroid follow up, Previsit labs, biopsy.    I spent 45 minutes in the care of the  patient today including review of labs from Thyroid Function, CMP, and other relevant labs ; imaging/biopsy records (current and previous including abstractions from other facilities); face-to-face time discussing  her lab results and symptoms, medications doses, her options of short and long term treatment based on the latest standards of care / guidelines;   and documenting the encounter.  Melrose Nakayama  participated in the discussions, expressed understanding, and voiced agreement with the above plans.  All questions were answered to her satisfaction. she is encouraged to contact clinic should she have any questions or  concerns prior to her return visit.    Rayetta Pigg, [facility] [address] [place], [place] [address] Phone: [phone] Fax: [phone]

## 2022-09-11 ENCOUNTER — Encounter: Payer: Self-pay | Admitting: Nurse Practitioner

## 2022-09-11 LAB — TSH: TSH: 2.94 u[IU]/mL (ref 0.450–4.500)

## 2022-09-11 LAB — T4, FREE: Free T4: 1.02 ng/dL (ref 0.82–1.77)

## 2022-09-11 LAB — T3, FREE: T3, Free: 2.9 pg/mL (ref 2.0–4.4)

## 2022-09-11 LAB — THYROGLOBULIN ANTIBODY: Thyroglobulin Antibody: <1 IU/mL (ref 0.0–0.9)

## 2022-09-11 LAB — THYROID PEROXIDASE ANTIBODY: Thyroperoxidase Ab SerPl-aCnc: <9 IU/mL (ref 0–34)

## 2022-09-13 NOTE — Progress Notes (Signed)
Patient for US guided FNA LT Mid Thyroid Nodule Bx on Mon 09/16/2022, I called and spoke  with the patient on the phone and gave pre-procedure instructions. Pt was made aware to be here at 12:30p at the [facility] registration desk. No other instructions. Pt stated understanding.

## 2022-09-16 ENCOUNTER — Ambulatory Visit
Admission: RE | Admit: 2022-09-16 | Discharge: 2022-09-16 | Disposition: A | Discharge status: Home / Self Care | Payer: BC Managed Care – PPO | Source: Ambulatory Visit | Attending: Nurse Practitioner | Admitting: Nurse Practitioner

## 2022-09-20 ENCOUNTER — Ambulatory Visit: Payer: BC Managed Care – PPO

## 2022-09-27 ENCOUNTER — Ambulatory Visit
Admission: RE | Admit: 2022-09-27 | Discharge: 2022-09-27 | Disposition: A | Discharge status: Home / Self Care | Payer: BC Managed Care – PPO | Source: Ambulatory Visit | Attending: Nurse Practitioner | Admitting: Nurse Practitioner

## 2022-09-27 DIAGNOSIS — E041 Nontoxic single thyroid nodule: Secondary | ICD-10-CM | POA: Insufficient documentation

## 2022-09-27 DIAGNOSIS — D440 Neoplasm of uncertain behavior of thyroid gland: Secondary | ICD-10-CM | POA: Diagnosis not present

## 2022-09-27 MED ORDER — LIDOCAINE HCL (PF) 1 % IJ SOLN
10.0000 mL | Freq: Once | INTRAMUSCULAR | Status: AC
  Administered 2022-09-27: 10 mL via INTRADERMAL
  Filled 2022-09-27: qty 10

## 2022-09-27 NOTE — Discharge Instructions (Signed)
After care instructions reviewed to patient by JLB

## 2022-09-30 HISTORY — PX: BIOPSY THYROID: PRO38

## 2022-10-04 ENCOUNTER — Ambulatory Visit: Payer: BLUE CROSS/BLUE SHIELD | Admitting: Nurse Practitioner

## 2022-10-11 ENCOUNTER — Ambulatory Visit: Payer: BLUE CROSS/BLUE SHIELD | Admitting: Nurse Practitioner

## 2022-10-16 ENCOUNTER — Telehealth: Payer: Self-pay | Admitting: Nurse Practitioner

## 2022-10-16 NOTE — Telephone Encounter (Signed)
No, I checked this morning, we do not have the results just yet.

## 2022-10-16 NOTE — Telephone Encounter (Signed)
I moved pt out another 2 weeks. If this come back sooner, please let me know and I will call pt to move appt up

## 2022-10-16 NOTE — Telephone Encounter (Signed)
Pt is calling because she is scheduled to be seen Friday, has her afirma come back?

## 2022-10-16 NOTE — Telephone Encounter (Signed)
Ok will do!

## 2022-10-17 ENCOUNTER — Telehealth: Payer: Self-pay | Admitting: "Endocrinology

## 2022-10-17 ENCOUNTER — Encounter: Payer: Self-pay | Admitting: Nurse Practitioner

## 2022-10-17 LAB — CYTOLOGY - NON PAP

## 2022-10-17 NOTE — Progress Notes (Signed)
I just got her Afirma results back.  She will need to get back on the schedule but will need to see Nida to discuss options.

## 2022-10-17 NOTE — Telephone Encounter (Signed)
Yes, but the Afirma results came back as cancer so I am diverting back to you at this point.  She has appt scheduled with you tomorrow.

## 2022-10-17 NOTE — Telephone Encounter (Signed)
New message    The patient calling stating she seen her test results asking for a call back to discuss

## 2022-10-18 ENCOUNTER — Ambulatory Visit: Payer: Self-pay | Admitting: Nurse Practitioner

## 2022-10-18 ENCOUNTER — Ambulatory Visit: Payer: BC Managed Care – PPO | Admitting: "Endocrinology

## 2022-10-18 ENCOUNTER — Encounter: Payer: Self-pay | Admitting: "Endocrinology

## 2022-10-18 VITALS — BP 110/78 | HR 68 | Ht 63.0 in | Wt 168.4 lb

## 2022-10-18 DIAGNOSIS — R899 Unspecified abnormal finding in specimens from other organs, systems and tissues: Secondary | ICD-10-CM

## 2022-10-18 NOTE — Progress Notes (Signed)
Endocrinology follow-up note  10/18/22    ---------------------------------------------------------------------------------------------------------------------- Subjective    Past Medical History:  Diagnosis Date   Acid reflux 11/24/2014   Contraceptive management 11/22/2013   Dyslipidemia 04/01/2017   Elevated cholesterol    GERD (gastroesophageal reflux disease)    Melanoma (HCC)    t2a   Vertigo     Past Surgical History:  Procedure Laterality Date   BIOPSY THYROID  09/2022   COLONOSCOPY WITH PROPOFOL N/A 12/24/2021   Procedure: COLONOSCOPY WITH PROPOFOL;  Surgeon: Lucilla Lame, MD;  [facility];  Service: Endoscopy;  Laterality: N/A;   excision of melanoma     POLYPECTOMY  12/24/2021   Procedure: POLYPECTOMY;  Surgeon: Lucilla Lame, MD;  Location: [facility];  Service: Endoscopy;;    Social History   Socioeconomic History   Marital status: Married    Spouse name: Not on file   Number of children: Not on file   Years of education: Not on file   Highest education level: Not on file  Occupational History   Not on file  Tobacco Use   Smoking status: Never   Smokeless tobacco: Never  Vaping Use   Vaping Use: Never used  Substance and Sexual Activity   Alcohol use: No   Drug use: No   Sexual activity: Yes    Birth control/protection: None, Post-menopausal  Other Topics Concern   Not on file  Social History Narrative   Not on file   Social Determinants of Health   Financial Resource Strain: Low Risk  (12/08/2020)   Overall Financial Resource Strain (CARDIA)    Difficulty of Paying Living Expenses: Not hard at all  Food Insecurity: No Food Insecurity (12/08/2020)   Hunger Vital Sign    Worried About Running Out of Food in the Last Year: Never true    Tice in the Last Year: Never true  Transportation Needs: No Transportation Needs (12/08/2020)   PRAPARE - Hydrologist (Medical): No    Lack  of Transportation (Non-Medical): No  Physical Activity: Sufficiently Active (12/08/2020)   Exercise Vital Sign    Days of Exercise per Week: 7 days    Minutes of Exercise per Session: 70 min  Stress: No Stress Concern Present (12/08/2020)   Derma    Feeling of Stress : Not at all  Social Connections: Moderately Integrated (12/08/2020)   Social Connection and Isolation Panel [NHANES]    Frequency of Communication with Friends and Family: More than three times a week    Frequency of Social Gatherings with Friends and Family: More than three times a week    Attends Religious Services: 1 to 4 times per year    Active Member of [employer] or Organizations: No    Attends Archivist Meetings: Never    Marital Status: Married  Human resources officer Violence: Not At Risk (12/08/2020)   Humiliation, Afraid, Rape, and Kick questionnaire    Fear of Current or Ex-Partner: No    Emotionally Abused: No    Physically Abused: No    Sexually Abused: No    Current Outpatient Medications on File Prior to Visit  Medication Sig Dispense Refill   cetirizine (ZYRTEC) 10 MG tablet Take by mouth.     ezetimibe (ZETIA) 10 MG tablet Take 10 mg by mouth daily.     NON FORMULARY X-factor vitamins Bio Cleanse Vita Biome Pro Bio5 MegaX  omeprazole (PRILOSEC) 20 MG capsule Take 20 mg by mouth daily.     OVER THE COUNTER MEDICATION Fish, Flax, and Borage Oil- patient takes 1 by mouth daily     VITAMIN D PO Take 2,000 Units by mouth daily.     No current facility-administered medications on file prior to visit.      HPI   Karen Mays is a 62 y.o.-year-old female, referred by her PCP, Dr.Fusco, for evaluation for multinodular goiter.  She has seen my colleague Brion Aliment, nurse practitioner for her last 2 visits.  During her last visit, she was sent for fine-needle aspiration biopsy of 4.7 cm nodule in the left lobe of her thyroid.   Biopsy results were inconclusive and required ThyroSeq molecular assessment which reveals 70-80% risk of malignancy. I reviewed her ultrasound, fine-needle aspiration biopsy findings, molecular study findings with her.  She has no new complaints today.  She is accompanied by her husband to clinic. She does have sister with hypothyroidism and a paternal aunt who had thyroid cancer.  No h/o radiation tx to head or neck.  No seaweed or kelp. No recent contrast studies. No steroid use. No herbal supplements. No Biotin supplements or Hair, Skin and Nails vitamins.  Pt also has a history of melanoma.  Review of systems  Constitutional: + Lost 10 pounds since last visit,  + fatigue, no subjective hyperthermia, no subjective hypothermia   ---------------------------------------------------------------------------------------------------------------------- Objective    BP 110/78   Pulse 68   Ht '5\' 3"'$  (1.6 m)   Wt 168 lb 6.4 oz (76.4 kg)   LMP 11/18/2014   BMI 29.83 kg/m    BP Readings from Last 3 Encounters:  10/18/22 110/78  09/27/22 126/78  09/06/22 100/69    Wt Readings from Last 3 Encounters:  10/18/22 168 lb 6.4 oz (76.4 kg)  09/06/22 166 lb 12.8 oz (75.7 kg)  12/24/21 158 lb (71.7 kg)     Physical Exam- Limited  Constitutional:  Body mass index is 29.83 kg/m. 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   ----------------------------------------------------------------------------------------------------------------------  ASSESSMENT / PLAN:  1.  Abnormal thyroid biopsy suspicious for malignancy I reviewed her studies so far including thyroid ultrasound, fine-needle aspiration biopsy results, and ThyroSeq molecular studies.  Patient has 70-80% probability of thyroid cancer would benefit from definitive intervention. The general approach was discussed with her including total thyroidectomy as a first step and adjuvant radioactive iodine thyroid remnant ablation if necessary subsequent to her surgery.  She is made aware of the fact that she will need thyroid hormone replacement for life. For her surgery, I will refer her to Dr. Armandina Gemma. She will be followed up with periodic imaging and thyroid hormone and thyroglobulin measurement.  She will return after her surgery with previsit labs.   Follow Up Plan: Return in about 5 weeks (around 11/22/2022) for F/U with Labs after Surgery.   She is advised to maintain close follow-up with her PCP.  I spent 26  minutes in the care of the patient today including review of labs from Thyroid Function, CMP, and other relevant labs ; imaging/biopsy records (current and previous including abstractions from other facilities); face-to-face time discussing  her lab results and symptoms, medications doses, her options of short and long term treatment based on the latest standards of care / guidelines;   and documenting the encounter.  Melrose Nakayama  participated in the discussions, expressed understanding, and voiced agreement with the above plans.  All questions were answered to her satisfaction. she is encouraged to contact clinic should she have any questions or concerns prior to her return visit.   Glade Lloyd , MD [facility] [address] [place], [place] [address] Phone: [phone] Fax: [phone]

## 2022-10-25 DIAGNOSIS — Z1283 Encounter for screening for malignant neoplasm of skin: Secondary | ICD-10-CM | POA: Diagnosis not present

## 2022-10-25 DIAGNOSIS — Z08 Encounter for follow-up examination after completed treatment for malignant neoplasm: Secondary | ICD-10-CM | POA: Diagnosis not present

## 2022-10-25 DIAGNOSIS — L820 Inflamed seborrheic keratosis: Secondary | ICD-10-CM | POA: Diagnosis not present

## 2022-10-25 DIAGNOSIS — D225 Melanocytic nevi of trunk: Secondary | ICD-10-CM | POA: Diagnosis not present

## 2022-10-25 DIAGNOSIS — Z85820 Personal history of malignant melanoma of skin: Secondary | ICD-10-CM | POA: Diagnosis not present

## 2022-10-25 DIAGNOSIS — L821 Other seborrheic keratosis: Secondary | ICD-10-CM | POA: Diagnosis not present

## 2022-11-01 ENCOUNTER — Ambulatory Visit: Payer: BC Managed Care – PPO | Admitting: Nurse Practitioner

## 2022-11-12 ENCOUNTER — Ambulatory Visit: Payer: Self-pay | Admitting: Surgery

## 2022-11-12 DIAGNOSIS — E049 Nontoxic goiter, unspecified: Secondary | ICD-10-CM | POA: Diagnosis not present

## 2022-11-12 DIAGNOSIS — E042 Nontoxic multinodular goiter: Secondary | ICD-10-CM | POA: Diagnosis not present

## 2022-11-12 DIAGNOSIS — D440 Neoplasm of uncertain behavior of thyroid gland: Secondary | ICD-10-CM | POA: Diagnosis not present

## 2022-11-16 NOTE — Patient Instructions (Signed)
SURGICAL WAITING ROOM VISITATION Patients having surgery or a procedure may have no more than 2 support people in the waiting area - these visitors may rotate in the visitor waiting room.   Due to an increase in RSV and influenza rates and associated hospitalizations, children ages 96 and under may not visit patients in [place]. If the patient needs to stay at the hospital during part of their recovery, the visitor guidelines for inpatient rooms apply.  PRE-OP VISITATION  Pre-op nurse will coordinate an appropriate time for 1 support person to accompany the patient in pre-op.  This support person may not rotate.  This visitor will be contacted when the time is appropriate for the visitor to come back in the pre-op area.  Please refer to the [facility] website for the visitor guidelines for Inpatients (after your surgery is over and you are in a regular room).  You are not required to quarantine at this time prior to your surgery. However, you must do this: Hand Hygiene often Do NOT share personal items Notify your provider if you are in close contact with someone who has COVID or you develop fever 100.4 or greater, new onset of sneezing, cough, sore throat, shortness of breath or body aches.  If you test positive for Covid or have been in contact with anyone that has tested positive in the last 10 days please notify you surgeon.    Your procedure is scheduled on: Friday  November 22, 2022   Report to [facility] Main Entrance: [place] entrance where the [employer] is available.   Report to admitting at: 09:45    AM  +++++Call this number if you have any questions or problems the morning of surgery [phone]  Do not eat food after Midnight the night prior to your surgery/procedure.  After Midnight you may have the following liquids until   09:00 AM  DAY OF SURGERY  Clear Liquid Diet Water Black Coffee (sugar ok, NO MILK/CREAM OR CREAMERS)  Tea (sugar ok,  NO MILK/CREAM OR CREAMERS) regular and decaf                             Plain Jell-O  with no fruit (NO RED)                                           Fruit ices (not with fruit pulp, NO RED)                                     Popsicles (NO RED)                                                                  Juice: apple, WHITE grape, WHITE cranberry Sports drinks like Gatorade or Powerade (NO RED)               FOLLOW  ANY ADDITIONAL PRE OP INSTRUCTIONS YOU RECEIVED FROM YOUR SURGEON'S OFFICE!!!   Oral Hygiene is also important to reduce your risk of infection.  Remember - BRUSH YOUR TEETH THE MORNING OF SURGERY WITH YOUR REGULAR TOOTHPASTE  Do NOT smoke after Midnight the night before surgery.  Take ONLY these medicines the morning of surgery with A SIP OF WATER: omeprazole. You may use your Xdemy eye drops if needed and you may take alprazolam if needed.                   You may not have any metal on your body including hair pins, jewelry, and body piercing  Do not wear make-up, lotions, powders, perfumes or deodorant  Do not wear nail polish including gel and S&S, artificial / acrylic nails, or any other type of covering on natural nails including finger and toenails. If you have artificial nails, gel coating, etc., that needs to be removed by a nail salon, Please have this removed prior to surgery. Not doing so may mean that your surgery could be cancelled or delayed if the Surgeon or anesthesia staff feels like they are unable to monitor you safely.   Do not shave 48 hours prior to surgery to avoid nicks in your skin which may contribute to postoperative infections.   Contacts, Hearing Aids, dentures or bridgework may not be worn into surgery. DENTURES WILL BE REMOVED PRIOR TO SURGERY PLEASE DO NOT APPLY "Poly grip" OR ADHESIVES!!!  You may bring a small overnight bag with you on the day of surgery, only pack items that are not valuable. [facility] IS NOT RESPONSIBLE    FOR VALUABLES THAT ARE LOST OR STOLEN.   Do not bring your home medications to the hospital. The Pharmacy will dispense medications listed on your medication list to you during your admission in the Hospital.  Special Instructions: Bring a copy of your healthcare power of attorney and living will documents the day of surgery, if you wish to have them scanned into your [facility] Records- EPIC  Please read over the following fact sheets you were given: IF YOU HAVE QUESTIONS ABOUT YOUR PRE-OP INSTRUCTIONS, PLEASE CALL [phone]  ([place])   [facility] - Preparing for Surgery Before surgery, you can play an important role.  Because skin is not sterile, your skin needs to be as free of germs as possible.  You can reduce the number of germs on your skin by washing with CHG (chlorahexidine gluconate) soap before surgery.  CHG is an antiseptic cleaner which kills germs and bonds with the skin to continue killing germs even after washing. Please DO NOT use if you have an allergy to CHG or antibacterial soaps.  If your skin becomes reddened/irritated stop using the CHG and inform your nurse when you arrive at Short Stay. Do not shave (including legs and underarms) for at least 48 hours prior to the first CHG shower.  You may shave your face/neck.  Please follow these instructions carefully:  1.  Shower with CHG Soap the night before surgery and the  morning of surgery.  2.  If you choose to wash your hair, wash your hair first as usual with your normal  shampoo.  3.  After you shampoo, rinse your hair and body thoroughly to remove the shampoo.                             4.  Use CHG as you would any other liquid soap.  You can apply chg directly to the skin and wash.  Gently with a scrungie or clean  washcloth.  5.  Apply the CHG Soap to your body ONLY FROM THE NECK DOWN.   Do not use on face/ open                           Wound or open sores. Avoid contact with eyes, ears mouth and genitals  (private parts).                       Wash face,  Genitals (private parts) with your normal soap.             6.  Wash thoroughly, paying special attention to the area where your  surgery  will be performed.  7.  Thoroughly rinse your body with warm water from the neck down.  8.  DO NOT shower/wash with your normal soap after using and rinsing off the CHG Soap.            9.  Pat yourself dry with a clean towel.            10.  Wear clean pajamas.            11.  Place clean sheets on your bed the night of your first shower and do not  sleep with pets.  ON THE DAY OF SURGERY : Do not apply any lotions/deodorants the morning of surgery.  Please wear clean clothes to the hospital/surgery center.    FAILURE TO FOLLOW THESE INSTRUCTIONS MAY RESULT IN THE CANCELLATION OF YOUR SURGERY  PATIENT SIGNATURE_________________________________  NURSE SIGNATURE__________________________________  ________________________________________________________________________

## 2022-11-16 NOTE — Progress Notes (Signed)
COVID Vaccine received:  [x]$  No []$  Yes Date of any COVID positive Test in last 90 days:  none  PCP - Marjean Donna, MD Cardiologist - None Endocrinology- Loni Beckwith MD  Chest x-ray -  EKG -   will do at PST  Stress Test -  ECHO -  Cardiac Cath -   PCR screen: [x]$  Ordered & Completed                      []$   No Order but Needs PROFEND                      []$   N/A for this surgery  Surgery Plan:  []$  Ambulatory                            [x]$  Outpatient in bed                            []$  Admit  Anesthesia:    [x]$  General  []$  Spinal                           []$   Choice []$   MAC  Pacemaker / ICD device [x]$  No []$  Yes        Device order form faxed [x]$  No    []$   Yes      Faxed to:  Spinal Cord Stimulator:[x]$  No []$  Yes      (Remind patient to bring remote DOS) Other Implants:   History of Sleep Apnea? [x]$  No []$  Yes   CPAP used?- [x]$  No []$  Yes    Does the patient monitor blood sugar? []$  No []$  Yes  [x]$  N/A   No DM  Blood Thinner / Instructions: none Aspirin Instructions:  none  ERAS Protocol Ordered: []$  No  [x]$  Yes [x]$  No Drink Ordered Patient is to be NPO after: 09:00 am  Activity level: Patient can climb a flight of stairs without difficulty; [x]$  No CP  [x]$  No SOB.  Patient can perform ADLs without assistance.   Anesthesia review: GERD, HTN, Thyroid Cancer  Patient denies shortness of breath, fever, cough and chest pain at PAT appointment.  Patient verbalized understanding and agreement to the Pre-Surgical Instructions that were given to them at this PAT appointment. Patient was also educated of the need to review these PAT instructions again prior to his/her surgery.I reviewed the appropriate phone numbers to call if they have any and questions or concerns.

## 2022-11-17 ENCOUNTER — Encounter: Payer: Self-pay | Admitting: Surgery

## 2022-11-17 DIAGNOSIS — D440 Neoplasm of uncertain behavior of thyroid gland: Secondary | ICD-10-CM | POA: Diagnosis present

## 2022-11-17 DIAGNOSIS — E049 Nontoxic goiter, unspecified: Secondary | ICD-10-CM | POA: Diagnosis present

## 2022-11-17 DIAGNOSIS — E042 Nontoxic multinodular goiter: Secondary | ICD-10-CM | POA: Diagnosis present

## 2022-11-17 NOTE — H&P (Signed)
REFERRING PHYSICIAN: Loni Beckwith  PROVIDER: Janssen Zee Charlotta Newton, MD   Chief Complaint: New Consultation (Thyroid nodule with atypia)  History of Present Illness:  Patient is referred by Dr. Loni Beckwith for surgical evaluation and management of a newly diagnosed thyroid neoplasm of uncertain behavior. Had initially been noted to have abnormalities on laboratory studies and exam. She underwent an ultrasound on December 14, 2021. This demonstrated an enlarged left thyroid lobe measuring 6.5 x 2.9 x 3.0 cm. In the right thyroid lobe was a 5 mm nodule. In the mid left thyroid lobe was an enlarged nodule measuring 4.7 cm. Fine-needle aspiration biopsy was recommended. Biopsy was performed on September 27, 2022. 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 The specimen was submitted for molecular genetic testing with ThyroSeq. Results returned as positive rendering a intermediate to high risk of malignancy of 70 to 80%. 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 Therefore the patient is referred for consideration for thyroidectomy for definitive diagnosis and management. TSH level is normal at 2.94. Patient has never been on thyroid medication. She has had no prior head or neck surgery. There is a family history of thyroid disease in the patient's father and sister, both of whom take thyroid medication. There is no family history of thyroid cancer. Patient presents today accompanied by her husband to discuss surgical intervention.  Review of Systems: A complete review of systems was obtained from the patient. I have reviewed this information and discussed as appropriate with the patient. See HPI as well for other ROS.  Review of Systems  Constitutional: Negative.  HENT:  Globus sensation Mild neck pain  Eyes: Negative.  Respiratory: Negative.  Cardiovascular: Negative.  Gastrointestinal:  Negative.  Genitourinary: Negative.  Musculoskeletal: Negative.  Skin: Negative.  Neurological: Negative.  Endo/Heme/Allergies: Negative.  Psychiatric/Behavioral: Negative.    Medical History: Past Medical History:  Diagnosis Date  Anxiety  Arthritis  GERD (gastroesophageal reflux disease)  Hypertension   Patient Active Problem List  Diagnosis  Multiple thyroid nodules  Enlarged thyroid gland  Neoplasm of uncertain behavior of thyroid gland   Past Surgical History:  Procedure Laterality Date  Melanoma - Leg Left 01/17/2020    Allergies  Allergen Reactions  Codeine Palpitations  Shaking, nervousness   Current Outpatient Medications on File Prior to Visit  Medication Sig Dispense Refill  ALPRAZolam (XANAX) 0.25 MG tablet Take 0.25 mg by mouth 3 (three) times daily  meclizine (ANTIVERT) 25 mg tablet 1 TABLET(S) 4 TIMES A DAY AS NEEDED ORAL  cetirizine (ZYRTEC) 10 MG tablet Take by mouth  ezetimibe (ZETIA) 10 mg tablet Take 10 mg by mouth once daily  NON FORMULARY X-factor vitamins Bio Cleanse Vita Biome Pro Bio5 MegaX  omeprazole (PRILOSEC) 20 MG DR capsule Take 20 mg by mouth once daily   No current facility-administered medications on file prior to visit.   History reviewed. No pertinent family history.   Social History   Tobacco Use  Smoking Status Never  Smokeless Tobacco Never    Social History   Socioeconomic History  Marital status: Married  Tobacco Use  Smoking status: Never  Smokeless tobacco: Never  Substance and Sexual Activity  Alcohol use: Never  Drug use: Never   Objective:   Vitals:  BP: 127/81  Pulse: 79  Temp: 36.7 C (98.1 F)  SpO2: 97%  Weight: 77.6 kg (171 lb)  Height: 162.6 cm (5' 4"$ )  PainSc: 0-No  pain   Body mass index is 29.35 kg/m.  Physical Exam   GENERAL APPEARANCE Comfortable, no acute issues Development: normal Gross deformities: none  SKIN Rash, lesions, ulcers: none Induration, erythema:  none Nodules: none palpable  EYES Conjunctiva and lids: normal Pupils: equal and reactive  EARS, NOSE, MOUTH, THROAT External ears: no lesion or deformity External nose: no lesion or deformity Hearing: grossly normal  NECK Symmetric: no Trachea: midline Thyroid: Right thyroid lobe is without palpable abnormality; left thyroid lobe has a dominant smooth mass measuring approximately 4 cm in size, mobile, nontender. There is no associated lymphadenopathy.  CHEST Respiratory effort: normal Retraction or accessory muscle use: no Breath sounds: normal bilaterally Rales, rhonchi, wheeze: none  CARDIOVASCULAR Auscultation: regular rhythm, normal rate Murmurs: none Pulses: radial pulse 2+ palpable Lower extremity edema: Mild edema left lower extremity at the ankle  ABDOMEN Not assessed  GENITOURINARY/RECTAL Not assessed  MUSCULOSKELETAL Station and gait: normal Digits and nails: no clubbing or cyanosis Muscle strength: grossly normal all extremities Range of motion: grossly normal all extremities Deformity: Well-healed surgical scar medial left lower leg consistent with history of melanoma.  LYMPHATIC Cervical: none palpable Supraclavicular: none palpable  PSYCHIATRIC Oriented to person, place, and time: yes Mood and affect: normal for situation Judgment and insight: appropriate for situation   Assessment and Plan:   Multiple thyroid nodules Enlarged thyroid gland Neoplasm of uncertain behavior of thyroid gland  Patient is referred by her endocrinologist for surgical evaluation and management of a newly identified thyroid neoplasm of uncertain behavior.  Patient provided with a copy of "The Thyroid Book: Medical and Surgical Treatment of Thyroid Problems", published by [employer], 16 pages. Book reviewed and explained to patient during visit today.  Today we reviewed her clinical history. We reviewed the ultrasound report and the cytopathology report. We reviewed the  report from the molecular genetic testing. I have recommended proceeding with total thyroidectomy given the fact that there are nodules bilaterally and that there is a genetic mutation which puts the patient at high risk for malignancy. We discussed total thyroidectomy. We discussed the risk and benefits including the risk of recurrent laryngeal nerve injury and injury to parathyroid glands. We discussed the size and location of the surgical incision. We discussed the hospital stay to be anticipated. We discussed her postoperative recovery. We discussed the potential need for radioactive iodine treatment. We discussed the need for lifelong thyroid hormone replacement. The patient understands and agrees to proceed with surgery in the near future.   Armandina Gemma, MD [facility] [place] practice Office: [phone]

## 2022-11-18 ENCOUNTER — Encounter (HOSPITAL_COMMUNITY)
Admission: RE | Admit: 2022-11-18 | Discharge: 2022-11-18 | Disposition: A | Discharge status: Home / Self Care | Payer: BC Managed Care – PPO | Source: Ambulatory Visit | Attending: Surgery | Admitting: Surgery

## 2022-11-18 ENCOUNTER — Encounter (HOSPITAL_COMMUNITY): Payer: Self-pay

## 2022-11-18 ENCOUNTER — Other Ambulatory Visit: Payer: Self-pay

## 2022-11-18 VITALS — BP 103/68 | HR 76 | Temp 98.3°F | Resp 16 | Ht 64.0 in | Wt 168.0 lb

## 2022-11-18 DIAGNOSIS — Z01818 Encounter for other preprocedural examination: Secondary | ICD-10-CM | POA: Diagnosis not present

## 2022-11-18 DIAGNOSIS — I10 Essential (primary) hypertension: Secondary | ICD-10-CM | POA: Diagnosis not present

## 2022-11-18 HISTORY — DX: Anxiety disorder, unspecified: F41.9

## 2022-11-18 HISTORY — DX: Essential (primary) hypertension: I10

## 2022-11-18 LAB — CBC
HCT: 39.6 % (ref 36.0–46.0)
Hemoglobin: 12.9 g/dL (ref 12.0–15.0)
MCH: 31.1 pg (ref 26.0–34.0)
MCHC: 32.6 g/dL (ref 30.0–36.0)
MCV: 95.4 fL (ref 80.0–100.0)
Platelets: 245 10*3/uL (ref 150–400)
RBC: 4.15 MIL/uL (ref 3.87–5.11)
RDW: 12.9 % (ref 11.5–15.5)
WBC: 8.5 10*3/uL (ref 4.0–10.5)
nRBC: 0 % (ref 0.0–0.2)

## 2022-11-18 LAB — BASIC METABOLIC PANEL
Anion gap: 12 (ref 5–15)
BUN: 14 mg/dL (ref 6–20)
CO2: 24 mmol/L (ref 22–32)
Calcium: 9.7 mg/dL (ref 8.9–10.3)
Chloride: 106 mmol/L (ref 98–111)
Creatinine, Ser: 0.95 mg/dL (ref 0.44–1.00)
GFR, Estimated: >60 mL/min (ref >60)
Glucose, Bld: 119 mg/dL — ABNORMAL HIGH (ref 70–99)
Potassium: 4.3 mmol/L (ref 3.5–5.1)
Sodium: 142 mmol/L (ref 135–145)

## 2022-11-19 ENCOUNTER — Other Ambulatory Visit (HOSPITAL_COMMUNITY): Payer: BC Managed Care – PPO

## 2022-11-22 ENCOUNTER — Ambulatory Visit (HOSPITAL_COMMUNITY)
Admission: RE | Admit: 2022-11-22 | Discharge: 2022-11-23 | Disposition: A | Discharge status: Home / Self Care | Payer: BC Managed Care – PPO | Source: Ambulatory Visit | Attending: Surgery | Admitting: Surgery

## 2022-11-22 ENCOUNTER — Encounter (HOSPITAL_COMMUNITY)
Admission: RE | Disposition: A | Discharge status: Home / Self Care | Payer: Self-pay | Source: Ambulatory Visit | Attending: Surgery

## 2022-11-22 ENCOUNTER — Other Ambulatory Visit: Payer: Self-pay

## 2022-11-22 ENCOUNTER — Ambulatory Visit (HOSPITAL_COMMUNITY): Payer: BC Managed Care – PPO | Admitting: Registered Nurse

## 2022-11-22 ENCOUNTER — Ambulatory Visit (HOSPITAL_COMMUNITY): Payer: BC Managed Care – PPO | Admitting: Physician Assistant

## 2022-11-22 ENCOUNTER — Encounter (HOSPITAL_COMMUNITY): Payer: Self-pay | Admitting: Surgery

## 2022-11-22 DIAGNOSIS — R918 Other nonspecific abnormal finding of lung field: Secondary | ICD-10-CM | POA: Diagnosis not present

## 2022-11-22 DIAGNOSIS — I10 Essential (primary) hypertension: Secondary | ICD-10-CM | POA: Diagnosis not present

## 2022-11-22 DIAGNOSIS — Z79899 Other long term (current) drug therapy: Secondary | ICD-10-CM | POA: Insufficient documentation

## 2022-11-22 DIAGNOSIS — E049 Nontoxic goiter, unspecified: Secondary | ICD-10-CM | POA: Diagnosis present

## 2022-11-22 DIAGNOSIS — F419 Anxiety disorder, unspecified: Secondary | ICD-10-CM | POA: Insufficient documentation

## 2022-11-22 DIAGNOSIS — K219 Gastro-esophageal reflux disease without esophagitis: Secondary | ICD-10-CM | POA: Diagnosis not present

## 2022-11-22 DIAGNOSIS — D440 Neoplasm of uncertain behavior of thyroid gland: Secondary | ICD-10-CM | POA: Diagnosis present

## 2022-11-22 DIAGNOSIS — E042 Nontoxic multinodular goiter: Secondary | ICD-10-CM | POA: Diagnosis not present

## 2022-11-22 HISTORY — PX: THYROIDECTOMY: SHX17

## 2022-11-22 SURGERY — THYROIDECTOMY
Anesthesia: General

## 2022-11-22 MED ORDER — FENTANYL CITRATE PF 50 MCG/ML IJ SOSY
PREFILLED_SYRINGE | INTRAMUSCULAR | Status: AC
  Filled 2022-11-22: qty 2

## 2022-11-22 MED ORDER — CHLORHEXIDINE GLUCONATE CLOTH 2 % EX PADS: 6.0000 | MEDICATED_PAD | Freq: Once | CUTANEOUS | Status: DC

## 2022-11-22 MED ORDER — TRAMADOL HCL 50 MG PO TABS
50.0000 mg | ORAL_TABLET | Freq: Four times a day (QID) | ORAL | Status: DC | PRN
  Administered 2022-11-22: 50 mg via ORAL
  Filled 2022-11-22: qty 1

## 2022-11-22 MED ORDER — OXYCODONE HCL 5 MG PO TABS: 5.0000 mg | ORAL_TABLET | Freq: Once | ORAL | Status: DC | PRN

## 2022-11-22 MED ORDER — AMISULPRIDE (ANTIEMETIC) 5 MG/2ML IV SOLN
INTRAVENOUS | Status: AC
  Filled 2022-11-22: qty 4

## 2022-11-22 MED ORDER — ROCURONIUM BROMIDE 10 MG/ML (PF) SYRINGE
PREFILLED_SYRINGE | INTRAVENOUS | Status: AC
  Filled 2022-11-22: qty 10

## 2022-11-22 MED ORDER — LACTATED RINGERS IV SOLN: INTRAVENOUS | Status: DC

## 2022-11-22 MED ORDER — FENTANYL CITRATE (PF) 100 MCG/2ML IJ SOLN
INTRAMUSCULAR | Status: AC
  Filled 2022-11-22: qty 2

## 2022-11-22 MED ORDER — FENTANYL CITRATE PF 50 MCG/ML IJ SOSY
25.0000 ug | PREFILLED_SYRINGE | INTRAMUSCULAR | Status: DC | PRN
  Administered 2022-11-22 (×2): 50 ug via INTRAVENOUS

## 2022-11-22 MED ORDER — SODIUM CHLORIDE 0.45 % IV SOLN
INTRAVENOUS | Status: DC
  Administered 2022-11-22: 1000 mL via INTRAVENOUS

## 2022-11-22 MED ORDER — FENTANYL CITRATE (PF) 100 MCG/2ML IJ SOLN
INTRAMUSCULAR | Status: DC | PRN
  Administered 2022-11-22 (×3): 25 ug via INTRAVENOUS
  Administered 2022-11-22: 100 ug via INTRAVENOUS
  Administered 2022-11-22: 25 ug via INTRAVENOUS

## 2022-11-22 MED ORDER — ACETAMINOPHEN 650 MG RE SUPP: 650.0000 mg | Freq: Four times a day (QID) | RECTAL | Status: DC | PRN

## 2022-11-22 MED ORDER — PANTOPRAZOLE SODIUM 40 MG PO TBEC
40.0000 mg | DELAYED_RELEASE_TABLET | Freq: Every day | ORAL | Status: DC
  Administered 2022-11-22 – 2022-11-23 (×2): 40 mg via ORAL
  Filled 2022-11-22 (×2): qty 1

## 2022-11-22 MED ORDER — CALCIUM CARBONATE ANTACID 500 MG PO CHEW
2.0000 | CHEWABLE_TABLET | Freq: Three times a day (TID) | ORAL | Status: DC
  Administered 2022-11-22 – 2022-11-23 (×2): 400 mg via ORAL
  Filled 2022-11-22 (×2): qty 2

## 2022-11-22 MED ORDER — HYDROMORPHONE HCL 1 MG/ML IJ SOLN: 1.0000 mg | INTRAMUSCULAR | Status: DC | PRN

## 2022-11-22 MED ORDER — ONDANSETRON HCL 4 MG/2ML IJ SOLN
4.0000 mg | Freq: Four times a day (QID) | INTRAMUSCULAR | Status: DC | PRN
  Filled 2022-11-22: qty 2

## 2022-11-22 MED ORDER — ORAL CARE MOUTH RINSE: 15.0000 mL | Freq: Once | OROMUCOSAL | Status: AC

## 2022-11-22 MED ORDER — OXYCODONE HCL 5 MG PO TABS: 5.0000 mg | ORAL_TABLET | ORAL | Status: DC | PRN

## 2022-11-22 MED ORDER — TRAMADOL HCL 50 MG PO TABS: 50.0000 mg | ORAL_TABLET | Freq: Four times a day (QID) | ORAL | 0 refills | Status: DC | PRN

## 2022-11-22 MED ORDER — OXYCODONE HCL 5 MG/5ML PO SOLN: 5.0000 mg | Freq: Once | ORAL | Status: DC | PRN

## 2022-11-22 MED ORDER — LIDOCAINE HCL (PF) 2 % IJ SOLN
INTRAMUSCULAR | Status: AC
  Filled 2022-11-22: qty 5

## 2022-11-22 MED ORDER — CHLORHEXIDINE GLUCONATE 0.12 % MT SOLN
15.0000 mL | Freq: Once | OROMUCOSAL | Status: AC
  Administered 2022-11-22: 15 mL via OROMUCOSAL

## 2022-11-22 MED ORDER — PROPOFOL 10 MG/ML IV BOLUS
INTRAVENOUS | Status: DC | PRN
  Administered 2022-11-22: 150 mg via INTRAVENOUS
  Administered 2022-11-22: 50 mg via INTRAVENOUS

## 2022-11-22 MED ORDER — ONDANSETRON 4 MG PO TBDP: 4.0000 mg | ORAL_TABLET | Freq: Four times a day (QID) | ORAL | Status: DC | PRN

## 2022-11-22 MED ORDER — DEXAMETHASONE SODIUM PHOSPHATE 10 MG/ML IJ SOLN
INTRAMUSCULAR | Status: DC | PRN
  Administered 2022-11-22: 10 mg via INTRAVENOUS

## 2022-11-22 MED ORDER — HEMOSTATIC AGENTS (NO CHARGE) OPTIME
TOPICAL | Status: DC | PRN
  Administered 2022-11-22: 1 via TOPICAL

## 2022-11-22 MED ORDER — 0.9 % SODIUM CHLORIDE (POUR BTL) OPTIME
TOPICAL | Status: DC | PRN
  Administered 2022-11-22: 1000 mL

## 2022-11-22 MED ORDER — LEVOTHYROXINE SODIUM 88 MCG PO TABS: 88.0000 ug | ORAL_TABLET | Freq: Every day | ORAL | 3 refills | Status: DC

## 2022-11-22 MED ORDER — ACETAMINOPHEN 325 MG PO TABS: 650.0000 mg | ORAL_TABLET | Freq: Four times a day (QID) | ORAL | Status: DC | PRN

## 2022-11-22 MED ORDER — ONDANSETRON HCL 4 MG/2ML IJ SOLN
INTRAMUSCULAR | Status: AC
  Filled 2022-11-22: qty 2

## 2022-11-22 MED ORDER — MIDAZOLAM HCL 5 MG/5ML IJ SOLN
INTRAMUSCULAR | Status: DC | PRN
  Administered 2022-11-22: 2 mg via INTRAVENOUS

## 2022-11-22 MED ORDER — ACETAMINOPHEN 500 MG PO TABS
1000.0000 mg | ORAL_TABLET | Freq: Once | ORAL | Status: AC
  Administered 2022-11-22: 1000 mg via ORAL
  Filled 2022-11-22: qty 2

## 2022-11-22 MED ORDER — LOTILANER 0.25 % OP SOLN: 1.0000 [drp] | Freq: Two times a day (BID) | OPHTHALMIC | Status: DC

## 2022-11-22 MED ORDER — DEXAMETHASONE SODIUM PHOSPHATE 10 MG/ML IJ SOLN
INTRAMUSCULAR | Status: AC
  Filled 2022-11-22: qty 1

## 2022-11-22 MED ORDER — ROCURONIUM BROMIDE 10 MG/ML (PF) SYRINGE
PREFILLED_SYRINGE | INTRAVENOUS | Status: DC | PRN
  Administered 2022-11-22 (×2): 10 mg via INTRAVENOUS
  Administered 2022-11-22: 50 mg via INTRAVENOUS

## 2022-11-22 MED ORDER — LIDOCAINE 2% (20 MG/ML) 5 ML SYRINGE
INTRAMUSCULAR | Status: DC | PRN
  Administered 2022-11-22: 80 mg via INTRAVENOUS

## 2022-11-22 MED ORDER — ONDANSETRON HCL 4 MG/2ML IJ SOLN
INTRAMUSCULAR | Status: DC | PRN
  Administered 2022-11-22: 4 mg via INTRAVENOUS

## 2022-11-22 MED ORDER — ALPRAZOLAM 0.25 MG PO TABS: 0.2500 mg | ORAL_TABLET | Freq: Every day | ORAL | Status: DC | PRN

## 2022-11-22 MED ORDER — PROPOFOL 10 MG/ML IV BOLUS
INTRAVENOUS | Status: AC
  Filled 2022-11-22: qty 20

## 2022-11-22 MED ORDER — SUGAMMADEX SODIUM 200 MG/2ML IV SOLN
INTRAVENOUS | Status: DC | PRN
  Administered 2022-11-22: 200 mg via INTRAVENOUS

## 2022-11-22 MED ORDER — CEFAZOLIN SODIUM-DEXTROSE 2-4 GM/100ML-% IV SOLN
2.0000 g | INTRAVENOUS | Status: AC
  Administered 2022-11-22: 2 g via INTRAVENOUS
  Filled 2022-11-22: qty 100

## 2022-11-22 MED ORDER — CALCIUM CARBONATE ANTACID 500 MG PO CHEW: 2.0000 | CHEWABLE_TABLET | Freq: Two times a day (BID) | ORAL | 1 refills | Status: DC

## 2022-11-22 MED ORDER — MIDAZOLAM HCL 2 MG/2ML IJ SOLN
INTRAMUSCULAR | Status: AC
  Filled 2022-11-22: qty 2

## 2022-11-22 MED ORDER — AMISULPRIDE (ANTIEMETIC) 5 MG/2ML IV SOLN
10.0000 mg | Freq: Once | INTRAVENOUS | Status: AC | PRN
  Administered 2022-11-22: 10 mg via INTRAVENOUS

## 2022-11-22 SURGICAL SUPPLY — 32 items
ADH SKN CLS APL DERMABOND .7 (GAUZE/BANDAGES/DRESSINGS) ×1
APL PRP STRL LF DISP 70% ISPRP (MISCELLANEOUS) ×1
ATTRACTOMAT 16X20 MAGNETIC DRP (DRAPES) ×2 IMPLANT
BAG COUNTER SPONGE SURGICOUNT (BAG) ×2 IMPLANT
BAG SPNG CNTER NS LX DISP (BAG) ×1
BLADE SURG 15 STRL LF DISP TIS (BLADE) ×2 IMPLANT
BLADE SURG 15 STRL SS (BLADE) ×1
CHLORAPREP W/TINT 26 (MISCELLANEOUS) ×2 IMPLANT
CLIP TI MEDIUM 6 (CLIP) ×4 IMPLANT
CLIP TI WIDE RED SMALL 6 (CLIP) ×4 IMPLANT
COVER SURGICAL LIGHT HANDLE (MISCELLANEOUS) ×2 IMPLANT
DERMABOND ADVANCED .7 DNX12 (GAUZE/BANDAGES/DRESSINGS) ×2 IMPLANT
DRAPE LAPAROTOMY T 98X78 PEDS (DRAPES) ×2 IMPLANT
DRAPE UTILITY XL STRL (DRAPES) ×2 IMPLANT
ELECT PENCIL ROCKER SW 15FT (MISCELLANEOUS) ×2 IMPLANT
ELECT REM PT RETURN 15FT ADLT (MISCELLANEOUS) ×2 IMPLANT
GAUZE 4X4 16PLY ~~LOC~~+RFID DBL (SPONGE) ×2 IMPLANT
GLOVE SURG ORTHO 8.0 STRL STRW (GLOVE) ×2 IMPLANT
GOWN STRL REUS W/ TWL XL LVL3 (GOWN DISPOSABLE) ×4 IMPLANT
GOWN STRL REUS W/TWL XL LVL3 (GOWN DISPOSABLE) ×2
HEMOSTAT SURGICEL 2X4 FIBR (HEMOSTASIS) ×2 IMPLANT
ILLUMINATOR WAVEGUIDE N/F (MISCELLANEOUS) ×2 IMPLANT
KIT BASIN OR (CUSTOM PROCEDURE TRAY) ×2 IMPLANT
KIT TURNOVER KIT A (KITS) IMPLANT
PACK BASIC VI WITH GOWN DISP (CUSTOM PROCEDURE TRAY) ×2 IMPLANT
SHEARS HARMONIC 9CM CVD (BLADE) ×2 IMPLANT
SUT MNCRL AB 4-0 PS2 18 (SUTURE) ×2 IMPLANT
SUT VIC AB 3-0 SH 18 (SUTURE) ×4 IMPLANT
SYR BULB IRRIG 60ML STRL (SYRINGE) ×2 IMPLANT
TOWEL OR 17X26 10 PK STRL BLUE (TOWEL DISPOSABLE) ×2 IMPLANT
TOWEL OR NON WOVEN STRL DISP B (DISPOSABLE) ×2 IMPLANT
TUBING CONNECTING 10 (TUBING) ×2 IMPLANT

## 2022-11-22 NOTE — Anesthesia Preprocedure Evaluation (Addendum)
Anesthesia Evaluation  Patient identified by MRN, date of birth, ID band Patient awake    Reviewed: Allergy & Precautions, NPO status , Patient's Chart, lab work & pertinent test results  History of Anesthesia Complications Negative for: history of anesthetic complications  Airway Mallampati: III  TM Distance: <3 FB Neck ROM: Full    Dental  (+) Dental Advisory Given   Pulmonary neg pulmonary ROS   Pulmonary exam normal breath sounds clear to auscultation       Cardiovascular hypertension, (-) angina (-) Past MI, (-) Cardiac Stents and (-) CABG + dysrhythmias (incomplete RBBB)  Rhythm:Regular Rate:Normal  HLD   Neuro/Psych neg Seizures PSYCHIATRIC DISORDERS Anxiety     vertigo    GI/Hepatic Neg liver ROS,GERD  Medicated,,  Endo/Other  neg diabetes  Thyroid neoplasm  Renal/GU negative Renal ROS     Musculoskeletal   Abdominal   Peds  Hematology negative hematology ROS (+)   Anesthesia Other Findings   Reproductive/Obstetrics                             Anesthesia Physical Anesthesia Plan  ASA: 2  Anesthesia Plan: General   Post-op Pain Management: Tylenol PO (pre-op)*   Induction: Intravenous  PONV Risk Score and Plan: 3 and Ondansetron, Dexamethasone and Treatment may vary due to age or medical condition  Airway Management Planned: Oral ETT  Additional Equipment:   Intra-op Plan:   Post-operative Plan: Extubation in OR  Informed Consent: I have reviewed the patients History and Physical, chart, labs and discussed the procedure including the risks, benefits and alternatives for the proposed anesthesia with the patient or authorized representative who has indicated his/her understanding and acceptance.     Dental advisory given  Plan Discussed with: CRNA and Anesthesiologist  Anesthesia Plan Comments: (Risks of general anesthesia discussed including, but not limited to,  sore throat, hoarse voice, chipped/damaged teeth, injury to vocal cords, nausea and vomiting, allergic reactions, lung infection, heart attack, stroke, and death. All questions answered. )        Anesthesia Quick Evaluation

## 2022-11-22 NOTE — TOC CM/SW Note (Signed)
Transition of Care [facility]) Screening Note  Patient Details  Name: Karen Mays Date of Birth: Feb 01, 1961  Transition of Care [facility]) CM/SW Contact:    Sherie Don, LCSW Phone Number: 11/22/2022, 4:17 PM  Transition of Care Department [facility]) has reviewed patient and no TOC needs have been identified at this time. We will continue to monitor patient advancement through interdisciplinary progression rounds. If new patient transition needs arise, please place a TOC consult.

## 2022-11-22 NOTE — Discharge Instructions (Signed)
[facility] - Dr. Marcela Alatorre  THYROID & PARATHYROID SURGERY:  POST-OP INSTRUCTIONS  Always review the instruction sheet provided by the hospital nurse at discharge.  A prescription for pain medication may be sent to your pharmacy at the time of discharge.  Take your pain medication as prescribed.  If narcotic pain medicine is not needed, then you may take acetaminophen (Tylenol) or ibuprofen (Advil) as needed for pain or soreness.  Take your normal home medications as prescribed unless otherwise directed.  If you need a refill on your pain medication, please contact the office during regular business hours.  Prescriptions will not be processed by the office after 5:00PM or on weekends.  Start with a light diet upon arrival home, such as soup and crackers or toast.  Be sure to drink plenty of fluids.  Resume your normal diet the day after surgery.  Most patients will experience some swelling and bruising on the chest and neck area.  Ice packs will help for the first 48 hours after arriving home.  Swelling and bruising will take several days to resolve.   It is common to experience some constipation after surgery.  Increasing fluid intake and taking a stool softener (Colace) will usually help to prevent this problem.  A mild laxative (Milk of Magnesia or Miralax) should be taken according to package directions if there has been no bowel movement after 48 hours.  Dermabond glue covers your incision. This seals the wound and you may shower at any time. The Dermabond will remain in place for about a week.  You may gradually remove the glue when it loosens around the edges.  If you need to loosen the Dermabond for removal, apply a layer of Vaseline to the wound for 15 minutes and then remove with a Kleenex. Your sutures are under the skin and will not show - they will dissolve on their own.  You may resume light daily activities beginning the day after discharge (such as self-care,  walking, climbing stairs), gradually increasing activities as tolerated. You may have sexual intercourse when it is comfortable. Refrain from any heavy lifting or straining until approved by your doctor. You may drive when you no longer are taking prescription pain medication, you can comfortably wear a seatbelt, and you can safely maneuver your car and apply the brakes.  You will see your doctor in the office for a follow-up appointment approximately three weeks after your surgery.  Make sure that you call for this appointment within a day or two after you arrive home to insure a convenient appointment time. Please have any requested laboratory tests performed a few days prior to your office visit so that the results will be available at your follow up appointment.  WHEN TO CALL THE [facility] OFFICE: -- Fever greater than 101.5 -- Inability to urinate -- Nausea and/or vomiting - persistent -- Extreme swelling or bruising -- Continued bleeding from incision -- Increased pain, redness, or drainage from the incision -- Difficulty swallowing or breathing -- Muscle cramping or spasms -- Numbness or tingling in hands or around lips  The clinic staff is available to answer your questions during regular business hours.  Please don't hesitate to call and ask to speak to one of the nurses if you have concerns.  [facility] OFFICE: [phone] (24 hours)  Please sign up for MyChart accounts. This will allow you to communicate directly with my nurse or myself without having to call the office. It will also allow you   to view your test results. You will need to enroll in MyChart for my office ([facility]) and for the hospital ([place]).  Shina Wass, MD [facility] A [facility] practice 

## 2022-11-22 NOTE — Op Note (Signed)
Procedure Note  Pre-operative Diagnosis:  thyroid neoplasm of uncertain behavior, multiple thyroid nodules, enlarged thyroid gland  Post-operative Diagnosis:  same  Surgeon:  Armandina Gemma, MD  Assistant:  none   Procedure:  Total thyroidectomy  Anesthesia:  General  Estimated Blood Loss:  minimal  Drains: none         Specimen: thyroid to pathology  Indications:  Patient is referred by Dr. Loni Beckwith for surgical evaluation and management of a newly diagnosed thyroid neoplasm of uncertain behavior. Had initially been noted to have abnormalities on laboratory studies and exam. She underwent an ultrasound on December 14, 2021. This demonstrated an enlarged left thyroid lobe measuring 6.5 x 2.9 x 3.0 cm. In the right thyroid lobe was a 5 mm nodule. In the mid left thyroid lobe was an enlarged nodule measuring 4.7 cm. Fine-needle aspiration biopsy was recommended. Biopsy was performed on September 27, 2022. 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 The specimen was submitted for molecular genetic testing with ThyroSeq. Results returned as positive rendering a intermediate to high risk of malignancy of 70 to 80%. 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 Therefore the patient is referred for consideration for thyroidectomy for definitive diagnosis and management. TSH level is normal at 2.94. Patient has never been on thyroid medication. She has had no prior head or neck surgery. There is a family history of thyroid disease in the patient's father and sister, both of whom take thyroid medication. There is no family history of thyroid cancer. Patient presents today accompanied by her husband to discuss surgical intervention.   Procedure Details: Procedure was done in OR #1 at the [facility]. The patient was brought to the operating room and placed in a supine position on the operating room table.  Following administration of general anesthesia, the patient was positioned and then prepped and draped in the usual aseptic fashion. After ascertaining that an adequate level of anesthesia had been achieved, a small Kocher incision was made with #15 blade. Dissection was carried through subcutaneous tissues and platysma.Hemostasis was achieved with the electrocautery. Skin flaps were elevated cephalad and caudad from the thyroid notch to the sternal notch. A Mahorner self-retaining retractor was placed for exposure. Strap muscles were incised in the midline and dissection was begun on the left side.  Strap muscles were reflected laterally.  Left thyroid lobe was mildly enlarged with a dominant nodule in the inferior lobe.  The left lobe was gently mobilized with blunt dissection. Superior pole vessels were dissected out and divided individually between small and medium ligaclips with the harmonic scalpel. The thyroid lobe was rolled anteriorly. Branches of the inferior thyroid artery were divided between small ligaclips with the harmonic scalpel. Inferior venous tributaries were divided between ligaclips. Both the superior and inferior parathyroid glands were identified and preserved on their vascular pedicles. The recurrent laryngeal nerve was identified and preserved along its course. The ligament of Gwenlyn Found was released with the electrocautery and the gland was mobilized onto the anterior trachea. Isthmus was mobilized across the midline. There was no significant pyramidal lobe present. Dry pack was placed in the left neck.  The right thyroid lobe was gently mobilized with blunt dissection. Right thyroid lobe was normal in size. Superior pole vessels were dissected out and divided between small and medium ligaclips with the Harmonic scalpel. Superior parathyroid was identified and preserved. Inferior venous tributaries were divided between medium ligaclips with  the harmonic scalpel. The right thyroid lobe was  rolled anteriorly and the branches of the inferior thyroid artery divided between small ligaclips. The right recurrent laryngeal nerve was identified and preserved along its course. The ligament of Gwenlyn Found was released with the electrocautery. The right thyroid lobe was mobilized onto the anterior trachea and the remainder of the thyroid was dissected off the anterior trachea and the thyroid was completely excised. A suture was used to mark the left lobe. The entire thyroid gland was submitted to pathology for review.  Palpation of the operative field demonstrated no evidence of residual disease and no abnormal lymph nodes.  The neck was irrigated with warm saline. Fibrillar was placed throughout the operative field. Strap muscles were approximated in the midline with interrupted 3-0 Vicryl sutures. Platysma was closed with interrupted 3-0 Vicryl sutures. Skin was closed with a running 4-0 Monocryl subcuticular suture. Wound was washed and Dermabond was applied. The patient was awakened from anesthesia and brought to the recovery room. The patient tolerated the procedure well.   Armandina Gemma, [facility] Office: [phone]

## 2022-11-22 NOTE — Anesthesia Procedure Notes (Signed)
Procedure Name: Intubation Date/Time: 11/22/2022 12:38 PM  Performed by: Victoriano Lain, CRNAPre-anesthesia Checklist: Patient identified, Emergency Drugs available, Suction available, Timeout performed and Patient being monitored Patient Re-evaluated:Patient Re-evaluated prior to induction Oxygen Delivery Method: Circle system utilized Preoxygenation: Pre-oxygenation with 100% oxygen Induction Type: IV induction Ventilation: Mask ventilation without difficulty Laryngoscope Size: Glidescope and 3 Grade View: Grade I Tube type: Oral Tube size: 7.5 mm Number of attempts: 2 Airway Equipment and Method: Rigid stylet and Video-laryngoscopy Placement Confirmation: ETT inserted through vocal cords under direct vision, positive ETCO2 and breath sounds checked- equal and bilateral Secured at: 21 cm Tube secured with: Tape Dental Injury: Teeth and Oropharynx as per pre-operative assessment  Difficulty Due To: Difficult Airway- due to anterior larynx Comments: PreO2. Smooth IV induction. Easy mask with oral airway. DL x 1 with Mac 4. Grade3 view. Masked. DL x 1 with #3 Glidescope. Grade 1 view. +ETCO2. BBS=. ATOI. ETT secured at 21 cm at the lip. VSS

## 2022-11-22 NOTE — Transfer of Care (Signed)
Immediate Anesthesia Transfer of Care Note  Patient: Karen Mays  Procedure(s) Performed: TOTAL THYROIDECTOMY  Patient Location: PACU  Anesthesia Type:General  Level of Consciousness: awake, alert , oriented, and patient cooperative  Airway & Oxygen Therapy: Patient Spontanous Breathing and Patient connected to face mask oxygen  Post-op Assessment: Report given to RN, Post -op Vital signs reviewed and stable, and Patient moving all extremities  Post vital signs: Reviewed and stable  Last Vitals:  Vitals Value Taken Time  BP 146/73 11/22/22 1419  Temp    Pulse 71 11/22/22 1421  Resp 13 11/22/22 1421  SpO2 100 % 11/22/22 1421  Vitals shown include unvalidated device data.  Last Pain:  Vitals:   11/22/22 1020  TempSrc: Oral  PainSc:          Complications: No notable events documented.

## 2022-11-22 NOTE — Interval H&P Note (Signed)
History and Physical Interval Note:  11/22/2022 12:06 PM  Karen Mays  has presented today for surgery, with the diagnosis of THYROID NEOPLASM OF UNCERTAIN BEHAVIOR.  The various methods of treatment have been discussed with the patient and family. After consideration of risks, benefits and other options for treatment, the patient has consented to    Procedure(s) with comments: TOTAL THYROIDECTOMY (N/A) - 1 HR 45 MIN as a surgical intervention.    The patient's history has been reviewed, patient examined, no change in status, stable for surgery.  I have reviewed the patient's chart and labs.  Questions were answered to the patient's satisfaction.    Armandina Gemma, [facility] A [place] practice Office: [place]

## 2022-11-23 ENCOUNTER — Other Ambulatory Visit: Payer: Self-pay

## 2022-11-23 DIAGNOSIS — I10 Essential (primary) hypertension: Secondary | ICD-10-CM | POA: Diagnosis not present

## 2022-11-23 DIAGNOSIS — E042 Nontoxic multinodular goiter: Secondary | ICD-10-CM | POA: Diagnosis not present

## 2022-11-23 DIAGNOSIS — F419 Anxiety disorder, unspecified: Secondary | ICD-10-CM | POA: Diagnosis not present

## 2022-11-23 DIAGNOSIS — Z79899 Other long term (current) drug therapy: Secondary | ICD-10-CM | POA: Diagnosis not present

## 2022-11-23 DIAGNOSIS — D440 Neoplasm of uncertain behavior of thyroid gland: Secondary | ICD-10-CM | POA: Diagnosis not present

## 2022-11-23 DIAGNOSIS — K219 Gastro-esophageal reflux disease without esophagitis: Secondary | ICD-10-CM | POA: Diagnosis not present

## 2022-11-23 LAB — CALCIUM: Calcium: 8.6 mg/dL — ABNORMAL LOW (ref 8.9–10.3)

## 2022-11-23 NOTE — Anesthesia Postprocedure Evaluation (Signed)
Anesthesia Post Note  Patient: Karen Mays  Procedure(s) Performed: TOTAL THYROIDECTOMY     Patient location during evaluation: PACU Anesthesia Type: General Level of consciousness: awake Pain management: pain level controlled Vital Signs Assessment: post-procedure vital signs reviewed and stable Respiratory status: spontaneous breathing, nonlabored ventilation and respiratory function stable Cardiovascular status: blood pressure returned to baseline and stable Postop Assessment: no apparent nausea or vomiting Anesthetic complications: no   No notable events documented.  Last Vitals:  Vitals:   11/23/22 0159 11/23/22 0540  BP: (!) 100/58 104/65  Pulse: 80 84  Resp: 18 18  Temp: 37.1 C 37.1 C  SpO2: 96% 96%    Last Pain:  Vitals:   11/23/22 0818  TempSrc:   PainSc: 2                  Nilda Simmer

## 2022-11-23 NOTE — Discharge Summary (Signed)
Physician Discharge Summary  Patient ID: Karen Mays MRN: [id number] DOB/AGE: 1961-08-16 62 y.o.  Admit date: 11/22/2022 Discharge date: 11/23/2022  Admission Diagnoses:  Discharge Diagnoses:  Principal Problem:   Neoplasm of uncertain behavior of thyroid gland Active Problems:   Multiple thyroid nodules   Enlarged thyroid   Discharged Condition: good  Hospital Course: uneventful post op recovery.  Discharged home POD#1 doing well  Consults: None  Significant Diagnostic Studies:   Treatments: surgery: total thyroidectomy  Discharge Exam: Blood pressure 104/65, pulse 84, temperature 98.7 F (37.1 C), temperature source Oral, resp. rate 18, height '5\' 4"'$  (1.626 m), weight 76.2 kg, last menstrual period 11/18/2014, SpO2 96 %. General appearance: alert, cooperative, and no distress Incision/Wound:incision on neck healing well, no hematoma, no hoarsness  Disposition: Discharge disposition: 01-Home or Self Care        Allergies as of 11/23/2022       Reactions   Azithromycin Other (See Comments)   Makes her groggy and can't walk   Codeine Palpitations        Medication List     TAKE these medications    acidophilus Caps capsule Take 1 capsule by mouth at bedtime.   ALPRAZolam 0.25 MG tablet Commonly known as: XANAX Take 0.25 mg by mouth daily as needed for anxiety.   calcium carbonate 500 MG chewable tablet Commonly known as: Tums Chew 2 tablets (400 mg of elemental calcium total) by mouth 2 (two) times daily.   cetirizine 10 MG tablet Commonly known as: ZYRTEC Take 10 mg by mouth daily as needed for allergies.   ezetimibe 10 MG tablet Commonly known as: ZETIA Take 10 mg by mouth daily.   levothyroxine 88 MCG tablet Commonly known as: Synthroid Take 1 tablet (88 mcg total) by mouth daily before breakfast.   meclizine 25 MG tablet Commonly known as: ANTIVERT Take 25 mg by mouth 3 (three) times daily as needed for dizziness.   omeprazole 20 MG  capsule Commonly known as: PRILOSEC Take 20 mg by mouth daily.   traMADol 50 MG tablet Commonly known as: ULTRAM Take 1 tablet (50 mg total) by mouth every 6 (six) hours as needed for moderate pain.   TRIPLE OMEGA COMPLEX PO Take 1 capsule by mouth daily.   VITAMIN D PO Take 2,000 Units by mouth daily.   Xdemvy 0.25 % Soln Generic drug: Lotilaner Place 1 drop into both eyes in the morning and at bedtime.        Follow-up Information     Armandina Gemma, MD. Schedule an appointment as soon as possible for a visit in 3 week(s).   Specialty: General Surgery Why: For wound re-check Contact information: [place] [id number] [phone]                 Signed: Coralie Keens 11/23/2022, 8:54 AM

## 2022-11-23 NOTE — Progress Notes (Signed)
Reviewed written D/C instructions with pt and all questions answered. Pt verbalized understanding. Pt left with all belongings in stable condition.

## 2022-11-23 NOTE — Progress Notes (Signed)
Patient ID: Karen Mays, female   DOB: 05-04-1961, 62 y.o.   MRN: [id number]   Feels well this morning Slept well Minimal pain, no hoarseness Incision healing well  Ca++8.6  Plan: Discharge home

## 2022-11-24 ENCOUNTER — Encounter (HOSPITAL_COMMUNITY): Payer: Self-pay | Admitting: Surgery

## 2022-11-26 LAB — SURGICAL PATHOLOGY

## 2022-11-26 NOTE — Progress Notes (Signed)
Final pathology is benign.  Good news.  Gould, MD [facility] A [place] practice Office: [phone]

## 2022-11-29 ENCOUNTER — Ambulatory Visit: Payer: BC Managed Care – PPO | Admitting: "Endocrinology

## 2022-12-13 DIAGNOSIS — E890 Postprocedural hypothyroidism: Secondary | ICD-10-CM | POA: Diagnosis not present

## 2022-12-13 DIAGNOSIS — E042 Nontoxic multinodular goiter: Secondary | ICD-10-CM | POA: Diagnosis not present

## 2022-12-13 DIAGNOSIS — E049 Nontoxic goiter, unspecified: Secondary | ICD-10-CM | POA: Diagnosis not present

## 2022-12-13 DIAGNOSIS — D440 Neoplasm of uncertain behavior of thyroid gland: Secondary | ICD-10-CM | POA: Diagnosis not present

## 2023-01-02 DIAGNOSIS — R899 Unspecified abnormal finding in specimens from other organs, systems and tissues: Secondary | ICD-10-CM | POA: Diagnosis not present

## 2023-01-03 LAB — COMPREHENSIVE METABOLIC PANEL
ALT: 29 IU/L (ref 0–32)
AST: 19 IU/L (ref 0–40)
Albumin/Globulin Ratio: 1.9 (ref 1.2–2.2)
Albumin: 4.3 g/dL (ref 3.8–4.9)
Alkaline Phosphatase: 109 IU/L (ref 44–121)
BUN/Creatinine Ratio: 13 (ref 12–28)
BUN: 14 mg/dL (ref 8–27)
Bilirubin Total: <0.2 mg/dL (ref 0.0–1.2)
CO2: 19 mmol/L — ABNORMAL LOW (ref 20–29)
Calcium: 9.4 mg/dL (ref 8.7–10.3)
Chloride: 102 mmol/L (ref 96–106)
Creatinine, Ser: 1.06 mg/dL — ABNORMAL HIGH (ref 0.57–1.00)
Globulin, Total: 2.3 g/dL (ref 1.5–4.5)
Glucose: 277 mg/dL — ABNORMAL HIGH (ref 70–99)
Potassium: 4.4 mmol/L (ref 3.5–5.2)
Sodium: 140 mmol/L (ref 134–144)
Total Protein: 6.6 g/dL (ref 6.0–8.5)
eGFR: 60 mL/min/{1.73_m2} (ref >59)

## 2023-01-03 LAB — TSH: TSH: 3.19 u[IU]/mL (ref 0.450–4.500)

## 2023-01-03 LAB — T4, FREE: Free T4: 1.31 ng/dL (ref 0.82–1.77)

## 2023-01-10 ENCOUNTER — Encounter: Payer: Self-pay | Admitting: "Endocrinology

## 2023-01-10 ENCOUNTER — Ambulatory Visit: Payer: BC Managed Care – PPO | Admitting: "Endocrinology

## 2023-01-10 VITALS — BP 94/66 | HR 64 | Ht 63.0 in | Wt 172.2 lb

## 2023-01-10 DIAGNOSIS — E890 Postprocedural hypothyroidism: Secondary | ICD-10-CM

## 2023-01-10 MED ORDER — CALCIUM CARBONATE ANTACID 500 MG PO CHEW: 1.0000 | CHEWABLE_TABLET | Freq: Every day | ORAL | 1 refills | Status: AC

## 2023-01-10 MED ORDER — LEVOTHYROXINE SODIUM 100 MCG PO TABS: 100.0000 ug | ORAL_TABLET | Freq: Every day | ORAL | 1 refills | Status: DC

## 2023-01-10 NOTE — Progress Notes (Signed)
Endocrinology follow-up note  01/10/23    ---------------------------------------------------------------------------------------------------------------------- Subjective    Past Medical History:  Diagnosis Date   Acid reflux 11/24/2014   Anxiety    Contraceptive management 11/22/2013   Dyslipidemia 04/01/2017   Elevated cholesterol    GERD (gastroesophageal reflux disease)    Hypertension    Melanoma    t2a   Vertigo     Past Surgical History:  Procedure Laterality Date   BIOPSY THYROID  09/2022   COLONOSCOPY WITH PROPOFOL N/A 12/24/2021   Procedure: COLONOSCOPY WITH PROPOFOL;  Surgeon: Midge Minium, MD;  Location: [facility];  Service: Endoscopy;  Laterality: N/A;   excision of melanoma     POLYPECTOMY  12/24/2021   Procedure: POLYPECTOMY;  Surgeon: Midge Minium, MD;  Location: [facility];  Service: Endoscopy;;   THYROIDECTOMY N/A 11/22/2022   Procedure: TOTAL THYROIDECTOMY;  Surgeon: Darnell Level, MD;  Location: [facility];  Service: General;  Laterality: N/A;  1 HR 45 MIN    Social History   Socioeconomic History   Marital status: Married    Spouse name: Not on file   Number of children: Not on file   Years of education: Not on file   Highest education level: Not on file  Occupational History   Not on file  Tobacco Use   Smoking status: Never   Smokeless tobacco: Never  Vaping Use   Vaping Use: Never used  Substance and Sexual Activity   Alcohol use: No   Drug use: No   Sexual activity: Yes    Birth control/protection: None, Post-menopausal  Other Topics Concern   Not on file  Social History Narrative   Not on file   Social Determinants of Health   Financial Resource Strain: Low Risk  (12/08/2020)   Overall Financial Resource Strain (CARDIA)    Difficulty of Paying Living Expenses: Not hard at all  Food Insecurity: No Food Insecurity (11/22/2022)   Hunger Vital Sign    Worried About Running Out of Food in the Last Year: Never true     Ran Out of Food in the Last Year: Never true  Transportation Needs: No Transportation Needs (11/22/2022)   PRAPARE - Transportation    Lack of Transportation (Medical): No    Lack of Transportation (Non-Medical): No  Physical Activity: Sufficiently Active (12/08/2020)   Exercise Vital Sign    Days of Exercise per Week: 7 days    Minutes of Exercise per Session: 70 min  Stress: No Stress Concern Present (12/08/2020)   [employer] of Occupational Health - Occupational Stress Questionnaire    Feeling of Stress : Not at all  Social Connections: Moderately Integrated (12/08/2020)   Social Connection and Isolation Panel [NHANES]    Frequency of Communication with Friends and Family: More than three times a week    Frequency of Social Gatherings with Friends and Family: More than three times a week    Attends Religious Services: 1 to 4 times per year    Active Member of [employer] or Organizations: No    Attends Banker Meetings: Never    Marital Status: Married  Catering manager Violence: Not At Risk (11/22/2022)   Humiliation, Afraid, Rape, and Kick questionnaire    Fear of Current or Ex-Partner: No    Emotionally Abused: No    Physically Abused: No    Sexually Abused: No    Current Outpatient Medications on File Prior to Visit  Medication Sig Dispense Refill   acidophilus (RISAQUAD) CAPS  capsule Take 1 capsule by mouth at bedtime.     ALPRAZolam (XANAX) 0.25 MG tablet Take 0.25 mg by mouth daily as needed for anxiety.     cetirizine (ZYRTEC) 10 MG tablet Take 10 mg by mouth daily as needed for allergies.     ezetimibe (ZETIA) 10 MG tablet Take 10 mg by mouth daily.     meclizine (ANTIVERT) 25 MG tablet Take 25 mg by mouth 3 (three) times daily as needed for dizziness.     Omega 3-6-9 Fatty Acids (TRIPLE OMEGA COMPLEX PO) Take 1 capsule by mouth daily.     omeprazole (PRILOSEC) 20 MG capsule Take 20 mg by mouth daily.     traMADol (ULTRAM) 50 MG tablet Take 1 tablet (50 mg  total) by mouth every 6 (six) hours as needed for moderate pain. 15 tablet 0   VITAMIN D PO Take 2,000 Units by mouth daily.     XDEMVY 0.25 % SOLN Place 1 drop into both eyes in the morning and at bedtime.     No current facility-administered medications on file prior to visit.      HPI   Karen Mays is a 62 y.o.-year-old female, referred by her PCP, Dr.Fusco, for evaluation for multinodular goiter.  She has seen my colleague Honor Junes, nurse practitioner for her last 2 visits.  During her last visit, she was sent for fine-needle aspiration biopsy of 4.7 cm nodule in the left lobe of her thyroid.  Biopsy results were inconclusive and required ThyroSeq molecular assessment which revealed 70-80% risk of malignancy. Based on this assessments, she was sent for surgery which she underwent on November 22, 2022.  Her surgical pathology findings are negative for malignancy.  See below. -She has recovered from her surgery.  She is currently on levothyroxine 88 mcg p.o. daily before breakfast.  She reports some fatigue.  She is accompanied by her husband to clinic.  She does have sister with hypothyroidism and a paternal aunt who had thyroid cancer.  No h/o radiation tx to head or neck.  No seaweed or kelp. No recent contrast studies. No steroid use. No herbal supplements. No Biotin supplements or Hair, Skin and Nails vitamins.  Pt also has a history of melanoma.  Review of systems  Constitutional: + Gained 4 pounds since last visit,   + fatigue, no subjective hyperthermia, no subjective hypothermia   ---------------------------------------------------------------------------------------------------------------------- Objective    BP 94/66   Pulse 64   Ht  (1.6 m)   Wt 172 lb 3.2 oz (78.1 kg)   LMP 11/18/2014   BMI 30.50 kg/m    BP Readings from Last 3 Encounters:  01/10/23 94/66  11/23/22 104/65  11/18/22 103/68    Wt Readings from Last 3 Encounters:  01/10/23 172 lb 3.2  oz (78.1 kg)  11/22/22 168 lb (76.2 kg)  11/18/22 168 lb (76.2 kg)     Physical Exam- Limited  Constitutional:  Body mass index is 30.5 kg/m. , not in acute distress, normal state of mind, got tearful when discussing possibility of thyroid cancer Eyes:  EOMI, no exophthalmos Neck: Supple Thyroid: + Healing thyroidectomy scar on anterior lower neck      Molecular pathology on September 27, 2022 Intermediate-high probability of cancer or NIFTP   Fine-needle aspiration on September 27, 2022 DIAGNOSIS:  THYROID GLAND, "LEFT MID NODULE"; ULTRASOUND-GUIDED FINE-NEEDLE  ASPIRATION (THINPREP AND SMEAR PREPARATIONS):  - FOLLICULAR CELLS WITH HURTHLE CELL CHANGES AND SCANT COLLOID MATERIAL,  CONSISTENT WITH ATYPIA OF  UNDETERMINED SIGNIFICANCE (BETHESDA CATEGORY III).    Total thyroidectomy on November 22, 2022  FINAL MICROSCOPIC DIAGNOSIS:   A. THYROID, TOTAL THYROIDECTOMY:  Multinodular goiter with dominant adenomatous nodule  Negative for malignancy  ----------------------------------------------------------------------------------------------------------------------  ASSESSMENT / PLAN:  1.  Postsurgical hypothyroidism After her presurgical workup was significant for high probability of thyroid cancer, she was sent for thyroidectomy.  However, her surgical sample did not reveal malignancy.  This is sufficient treatment, will not need radioactive iodine ablation at this time.   Based on her most recent thyroid function test, she will benefit from slight increase in her levothyroxine. I discussed and increase her levothyroxine to 100 mcg p.o. daily before breakfast.  - We discussed about the correct intake of her thyroid hormone, on empty stomach at fasting, with water, separated by at least 30 minutes from breakfast and other medications,  and separated by more than 4 hours from calcium, iron, multivitamins, acid reflux medications (PPIs). -Patient is made aware of the fact that  thyroid hormone replacement is needed for life, dose to be adjusted by periodic monitoring of thyroid function tests. -She did have transient dip in her calcium, will benefit from low-dose calcium supplement.  I advised her to continue calcium carbonate 500 mg p.o. daily at supper.  Follow Up Plan: Return in about 6 months (around 07/12/2023) for F/U with Pre-visit Labs.   She is advised to maintain close follow-up with her PCP.   I spent  22  minutes in the care of the patient today including review of labs from Thyroid Function, CMP, and other relevant labs ; imaging/biopsy records (current and previous including abstractions from other facilities); face-to-face time discussing  her lab results and symptoms, medications doses, her options of short and long term treatment based on the latest standards of care / guidelines;   and documenting the encounter.  Nell Range  participated in the discussions, expressed understanding, and voiced agreement with the above plans.  All questions were answered to her satisfaction. she is encouraged to contact clinic should she have any questions or concerns prior to her return visit.   Marquis Lunch , MD [facility] [address] [place], [place] [address] Phone: [phone] Fax: [phone]

## 2023-05-16 DIAGNOSIS — C44712 Basal cell carcinoma of skin of right lower limb, including hip: Secondary | ICD-10-CM | POA: Diagnosis not present

## 2023-05-16 DIAGNOSIS — D225 Melanocytic nevi of trunk: Secondary | ICD-10-CM | POA: Diagnosis not present

## 2023-05-16 DIAGNOSIS — Z85820 Personal history of malignant melanoma of skin: Secondary | ICD-10-CM | POA: Diagnosis not present

## 2023-05-16 DIAGNOSIS — Z1283 Encounter for screening for malignant neoplasm of skin: Secondary | ICD-10-CM | POA: Diagnosis not present

## 2023-05-16 DIAGNOSIS — Z08 Encounter for follow-up examination after completed treatment for malignant neoplasm: Secondary | ICD-10-CM | POA: Diagnosis not present

## 2023-05-23 DIAGNOSIS — Z9229 Personal history of other drug therapy: Secondary | ICD-10-CM | POA: Diagnosis not present

## 2023-05-23 DIAGNOSIS — Z6827 Body mass index (BMI) 27.0-27.9, adult: Secondary | ICD-10-CM | POA: Diagnosis not present

## 2023-05-23 DIAGNOSIS — R5383 Other fatigue: Secondary | ICD-10-CM | POA: Diagnosis not present

## 2023-05-23 DIAGNOSIS — E039 Hypothyroidism, unspecified: Secondary | ICD-10-CM | POA: Diagnosis not present

## 2023-05-23 DIAGNOSIS — Z0001 Encounter for general adult medical examination with abnormal findings: Secondary | ICD-10-CM | POA: Diagnosis not present

## 2023-05-23 DIAGNOSIS — Z1331 Encounter for screening for depression: Secondary | ICD-10-CM | POA: Diagnosis not present

## 2023-05-23 DIAGNOSIS — R7309 Other abnormal glucose: Secondary | ICD-10-CM | POA: Diagnosis not present

## 2023-05-23 DIAGNOSIS — E559 Vitamin D deficiency, unspecified: Secondary | ICD-10-CM | POA: Diagnosis not present

## 2023-07-16 ENCOUNTER — Other Ambulatory Visit: Payer: Self-pay | Admitting: "Endocrinology

## 2023-07-18 DIAGNOSIS — Z08 Encounter for follow-up examination after completed treatment for malignant neoplasm: Secondary | ICD-10-CM | POA: Diagnosis not present

## 2023-07-18 DIAGNOSIS — Z85828 Personal history of other malignant neoplasm of skin: Secondary | ICD-10-CM | POA: Diagnosis not present

## 2023-07-22 DIAGNOSIS — E890 Postprocedural hypothyroidism: Secondary | ICD-10-CM | POA: Diagnosis not present

## 2023-07-23 LAB — COMPREHENSIVE METABOLIC PANEL
ALT: 23 [IU]/L (ref 0–32)
AST: 19 [IU]/L (ref 0–40)
Albumin: 4.3 g/dL (ref 3.8–4.9)
Alkaline Phosphatase: 99 [IU]/L (ref 44–121)
BUN/Creatinine Ratio: 14 (ref 12–28)
BUN: 16 mg/dL (ref 8–27)
Bilirubin Total: 0.2 mg/dL (ref 0.0–1.2)
CO2: 22 mmol/L (ref 20–29)
Calcium: 9.1 mg/dL (ref 8.7–10.3)
Chloride: 102 mmol/L (ref 96–106)
Creatinine, Ser: 1.14 mg/dL — ABNORMAL HIGH (ref 0.57–1.00)
Globulin, Total: 2.5 g/dL (ref 1.5–4.5)
Glucose: 192 mg/dL — ABNORMAL HIGH (ref 70–99)
Potassium: 4.5 mmol/L (ref 3.5–5.2)
Sodium: 140 mmol/L (ref 134–144)
Total Protein: 6.8 g/dL (ref 6.0–8.5)
eGFR: 55 mL/min/{1.73_m2} — ABNORMAL LOW (ref >59)

## 2023-07-23 LAB — TSH: TSH: 1.72 u[IU]/mL (ref 0.450–4.500)

## 2023-07-23 LAB — T4, FREE: Free T4: 1.47 ng/dL (ref 0.82–1.77)

## 2023-07-25 ENCOUNTER — Ambulatory Visit (INDEPENDENT_AMBULATORY_CARE_PROVIDER_SITE_OTHER): Payer: BC Managed Care – PPO | Admitting: "Endocrinology

## 2023-07-25 ENCOUNTER — Encounter: Payer: Self-pay | Admitting: "Endocrinology

## 2023-07-25 VITALS — BP 100/70 | HR 68 | Ht 63.0 in | Wt 169.0 lb

## 2023-07-25 DIAGNOSIS — E890 Postprocedural hypothyroidism: Secondary | ICD-10-CM | POA: Diagnosis not present

## 2023-07-25 MED ORDER — LEVOTHYROXINE SODIUM 100 MCG PO TABS: 100.0000 ug | ORAL_TABLET | Freq: Every day | ORAL | 1 refills | Status: DC

## 2023-07-25 NOTE — Progress Notes (Signed)
Endocrinology follow-up note  07/25/23     Subjective    Past Medical History:  Diagnosis Date   Acid reflux 11/24/2014   Anxiety    Contraceptive management 11/22/2013   Dyslipidemia 04/01/2017   Elevated cholesterol    GERD (gastroesophageal reflux disease)    Hypertension    Melanoma (HCC)    t2a   Vertigo     Past Surgical History:  Procedure Laterality Date   BIOPSY THYROID  09/2022   COLONOSCOPY WITH PROPOFOL N/A 12/24/2021   Procedure: COLONOSCOPY WITH PROPOFOL;  Surgeon: Midge Minium, MD;  Location: [facility];  Service: Endoscopy;  Laterality: N/A;   excision of melanoma     POLYPECTOMY  12/24/2021   Procedure: POLYPECTOMY;  Surgeon: Midge Minium, MD;  Location: [facility];  Service: Endoscopy;;   THYROIDECTOMY N/A 11/22/2022   Procedure: TOTAL THYROIDECTOMY;  Surgeon: Darnell Level, MD;  Location: [facility];  Service: General;  Laterality: N/A;  1 HR 45 MIN    Social History   Socioeconomic History   Marital status: Married    Spouse name: Not on file   Number of children: Not on file   Years of education: Not on file   Highest education level: Not on file  Occupational History   Not on file  Tobacco Use   Smoking status: Never   Smokeless tobacco: Never  Vaping Use   Vaping status: Never Used  Substance and Sexual Activity   Alcohol use: No   Drug use: No   Sexual activity: Yes    Birth control/protection: None, Post-menopausal  Other Topics Concern   Not on file  Social History Narrative   Not on file   Social Determinants of Health   Financial Resource Strain: Low Risk  (12/08/2020)   Overall Financial Resource Strain (CARDIA)    Difficulty of Paying Living Expenses: Not hard at all  Food Insecurity: No Food Insecurity (11/22/2022)   Hunger Vital Sign    Worried About Running Out of Food in the Last Year: Never true    Ran Out of Food in the Last Year: Never true  Transportation Needs: No Transportation Needs (11/22/2022)    PRAPARE - Transportation    Lack of Transportation (Medical): No    Lack of Transportation (Non-Medical): No  Physical Activity: Sufficiently Active (12/08/2020)   Exercise Vital Sign    Days of Exercise per Week: 7 days    Minutes of Exercise per Session: 70 min  Stress: No Stress Concern Present (12/08/2020)   [employer] of Occupational Health - Occupational Stress Questionnaire    Feeling of Stress : Not at all  Social Connections: Moderately Integrated (12/08/2020)   Social Connection and Isolation Panel [NHANES]    Frequency of Communication with Friends and Family: More than three times a week    Frequency of Social Gatherings with Friends and Family: More than three times a week    Attends Religious Services: 1 to 4 times per year    Active Member of [employer] or Organizations: No    Attends Banker Meetings: Never    Marital Status: Married  Catering manager Violence: Not At Risk (11/22/2022)   Humiliation, Afraid, Rape, and Kick questionnaire    Fear of Current or Ex-Partner: No    Emotionally Abused: No    Physically Abused: No    Sexually Abused: No    Current Outpatient Medications on File Prior to Visit  Medication Sig Dispense Refill   acidophilus (RISAQUAD)  CAPS capsule Take 1 capsule by mouth at bedtime.     ALPRAZolam (XANAX) 0.25 MG tablet Take 0.25 mg by mouth daily as needed for anxiety.     calcium carbonate (TUMS) 500 MG chewable tablet Chew 1 tablet (200 mg of elemental calcium total) by mouth daily with supper. 90 tablet 1   cetirizine (ZYRTEC) 10 MG tablet Take 10 mg by mouth daily as needed for allergies.     ezetimibe (ZETIA) 10 MG tablet Take 10 mg by mouth daily.     meclizine (ANTIVERT) 25 MG tablet Take 25 mg by mouth 3 (three) times daily as needed for dizziness.     Omega 3-6-9 Fatty Acids (TRIPLE OMEGA COMPLEX PO) Take 1 capsule by mouth daily.     omeprazole (PRILOSEC) 20 MG capsule Take 20 mg by mouth daily.     traMADol (ULTRAM) 50  MG tablet Take 1 tablet (50 mg total) by mouth every 6 (six) hours as needed for moderate pain. 15 tablet 0   VITAMIN D PO Take 2,000 Units by mouth daily.     XDEMVY 0.25 % SOLN Place 1 drop into both eyes in the morning and at bedtime.     No current facility-administered medications on file prior to visit.      HPI   Karen Mays is a 62 y.o.-year-old female, referred by her PCP, Dr.Fusco, for evaluation for multinodular goiter.  She has seen my colleague Honor Junes, nurse practitioner for her last 2 visits.  During her last visit, she was sent for fine-needle aspiration biopsy of 4.7 cm nodule in the left lobe of her thyroid.  Biopsy results were inconclusive and required ThyroSeq molecular assessment which revealed 70-80% risk of malignancy. Based on this assessments, she was sent for surgery which she underwent on November 22, 2022.  Her surgical pathology findings are negative for malignancy.  See below. -She has recovered from her surgery.  She is currently on levothyroxine 100 mcg p.o. daily before breakfast.  She still reports some fatigue, however her labs are consistent with appropriate replacement. She does have sister with hypothyroidism and a paternal aunt who had thyroid cancer.  No h/o radiation tx to head or neck. Pt also has a history of melanoma.  She denies dysphagia, shortness of breath, voice change.  Review of systems  Constitutional: + Presents with 3 pounds of weight loss, however she thinks she is gaining weight.      ---------------------------------------------------------------------------------------------------------------------- Objective    BP 100/70   Pulse 68   Ht 5\' 3"  (1.6 m)   Wt 169 lb (76.7 kg)   LMP 11/18/2014   BMI 29.94 kg/m    BP Readings from Last 3 Encounters:  07/25/23 100/70  01/10/23 94/66  11/23/22 104/65    Wt Readings from Last 3 Encounters:  07/25/23 169 lb (76.7 kg)  01/10/23 172 lb 3.2 oz (78.1 kg)  11/22/22 168 lb  (76.2 kg)     Physical Exam- Limited  Constitutional:  Body mass index is 29.94 kg/m. , not in acute distress, normal state of mind, got tearful when discussing possibility of thyroid cancer Eyes:  EOMI, no exophthalmos Neck: Supple Thyroid: + Healing thyroidectomy scar on anterior lower neck      Molecular pathology on September 27, 2022 Intermediate-high probability of cancer or NIFTP   Fine-needle aspiration on September 27, 2022 DIAGNOSIS:  THYROID GLAND, "LEFT MID NODULE"; ULTRASOUND-GUIDED FINE-NEEDLE  ASPIRATION (THINPREP AND SMEAR PREPARATIONS):  - FOLLICULAR CELLS WITH HURTHLE CELL CHANGES  AND SCANT COLLOID MATERIAL,  CONSISTENT WITH ATYPIA OF UNDETERMINED SIGNIFICANCE (BETHESDA CATEGORY III).    Total thyroidectomy on November 22, 2022  FINAL MICROSCOPIC DIAGNOSIS:   A. THYROID, TOTAL THYROIDECTOMY:  Multinodular goiter with dominant adenomatous nodule  Negative for malignancy  ----------------------------------------------------------------------------------------------------------------------  ASSESSMENT / PLAN:  1.  Postsurgical hypothyroidism After her presurgical workup was significant for high probability of thyroid cancer, she was sent for thyroidectomy.  However, her surgical sample did not reveal malignancy.  This is sufficient treatment, will not need radioactive iodine ablation at this time.   Based on her most recent thyroid function test, her current dose of levothyroxine is appropriate.  She is advised to continue levothyroxine 100 mcg p.o. daily before breakfast.     - We discussed about the correct intake of her thyroid hormone, on empty stomach at fasting, with water, separated by at least 30 minutes from breakfast and other medications,  and separated by more than 4 hours from calcium, iron, multivitamins, acid reflux medications (PPIs). -Patient is made aware of the fact that thyroid hormone replacement is needed for life, dose to be adjusted by  periodic monitoring of thyroid function tests.  -She did have transient dip in her calcium, will benefit from low-dose calcium supplement.  -She will be considered for surveillance thyroid/neck ultrasound before her next visit.  I advised her to continue calcium carbonate 500 mg p.o. daily at supper.  Follow Up Plan: Return in about 6 months (around 01/23/2024) for F/U with Pre-visit Labs, Thyroid / Neck Ultrasound.   She is advised to maintain close follow-up with her PCP.   I spent  21  minutes in the care of the patient today including review of labs from Thyroid Function, CMP, and other relevant labs ; imaging/biopsy records (current and previous including abstractions from other facilities); face-to-face time discussing  her lab results and symptoms, medications doses, her options of short and long term treatment based on the latest standards of care / guidelines;   and documenting the encounter.  Nell Range  participated in the discussions, expressed understanding, and voiced agreement with the above plans.  All questions were answered to her satisfaction. she is encouraged to contact clinic should she have any questions or concerns prior to her return visit.    Marquis Lunch , MD [facility] [address] [place], [place] [address] Phone: [phone] Fax: [phone]

## 2023-08-22 ENCOUNTER — Ambulatory Visit
Admission: EM | Admit: 2023-08-22 | Discharge: 2023-08-22 | Disposition: A | Discharge status: Home / Self Care | Payer: BC Managed Care – PPO | Attending: Emergency Medicine | Admitting: Emergency Medicine

## 2023-08-22 ENCOUNTER — Encounter: Payer: Self-pay | Admitting: Emergency Medicine

## 2023-08-22 DIAGNOSIS — H6501 Acute serous otitis media, right ear: Secondary | ICD-10-CM

## 2023-08-22 DIAGNOSIS — J0100 Acute maxillary sinusitis, unspecified: Secondary | ICD-10-CM | POA: Diagnosis not present

## 2023-08-22 MED ORDER — BENZONATATE 200 MG PO CAPS: 200.0000 mg | ORAL_CAPSULE | Freq: Three times a day (TID) | ORAL | 0 refills | Status: DC | PRN

## 2023-08-22 MED ORDER — DOXYCYCLINE HYCLATE 100 MG PO CAPS: 100.0000 mg | ORAL_CAPSULE | Freq: Two times a day (BID) | ORAL | 0 refills | Status: AC

## 2023-08-22 NOTE — ED Provider Notes (Signed)
HPI  SUBJECTIVE:  Karen Mays is a 62 y.o. female who presents with 8 days of sinus pain and pressure, right ear fullness, cough occasionally productive of phlegm.  Denies ear pain, change in hearing, tinnitus or vertigo.  She reports nasal congestion, clear/green rhinorrhea, postnasal drip.  She is unable to sleep at night because of the cough.  No fevers, facial swelling, upper dental pain, wheezing, shortness of breath.  She had an e-visit 2 days ago and was prescribed amoxicillin 875 mg twice daily.  She is on day #2 of 7.  She states it is not making a difference.  She has also tried Mucinex PE, Tylenol sinus, Zyrtec and Flonase.  No aggravating or alleviating factors.  She has a past medical history of allergies, GERD, hypothyroidism.  No history of pulmonary disease, denies history of hypertension.  PCP: [facility].  She had an e-visit and was prescribed amoxicillin, but the record of this visit is not available in epic or Care Everywhere.  Past Medical History:  Diagnosis Date   Acid reflux 11/24/2014   Anxiety    Contraceptive management 11/22/2013   Dyslipidemia 04/01/2017   Elevated cholesterol    GERD (gastroesophageal reflux disease)    Hypertension    Melanoma (HCC)    t2a   Vertigo     Past Surgical History:  Procedure Laterality Date   BIOPSY THYROID  09/2022   COLONOSCOPY WITH PROPOFOL N/A 12/24/2021   Procedure: COLONOSCOPY WITH PROPOFOL;  Surgeon: Midge Minium, MD;  Location: [facility];  Service: Endoscopy;  Laterality: N/A;   excision of melanoma     POLYPECTOMY  12/24/2021   Procedure: POLYPECTOMY;  Surgeon: Midge Minium, MD;  Location: [facility];  Service: Endoscopy;;   THYROIDECTOMY N/A 11/22/2022   Procedure: TOTAL THYROIDECTOMY;  Surgeon: Darnell Level, MD;  Location: [facility];  Service: General;  Laterality: N/A;  1 HR 45 MIN    Family History  Problem Relation Age of Onset   Hyperlipidemia Mother    Heart disease Mother     Diabetes Mother    Heart disease Father    Hyperlipidemia Father    Hypertension Father    Diabetes Father    Other Father        has a pacemaker   Diabetes Sister        borderline   Cancer Paternal Aunt        stomach   Diabetes Paternal Uncle    Hypertension Paternal Uncle    Cancer Paternal Aunt        brain tumor   Diabetes Paternal Aunt    Hypertension Paternal Aunt    Cancer Paternal Aunt        ovarian   Diabetes Paternal Aunt    Hypertension Paternal Aunt    Cancer Cousin 40       ovarian   Hypertension Paternal Uncle    Diabetes Paternal Uncle    Cancer Maternal Grandmother        lung   Cancer Maternal Grandfather        lung   Heart attack Paternal Grandmother    Other Paternal Grandfather        aneursym   Diabetes Paternal Aunt    Hypertension Paternal Aunt    Diabetes Paternal Aunt    Hypertension Paternal Aunt    Diabetes Paternal Aunt    Hypertension Paternal Aunt    Diabetes Paternal Aunt    Hypertension Paternal Aunt    Diabetes  Paternal Uncle    Hypertension Paternal Uncle    Diabetes Paternal Uncle    Hypertension Paternal Uncle    Diabetes Paternal Uncle    Hypertension Paternal Uncle    Diabetes Paternal Uncle    Hypertension Paternal Uncle    Diabetes Paternal Uncle    Hypertension Paternal Uncle     Social History   Tobacco Use   Smoking status: Never   Smokeless tobacco: Never  Vaping Use   Vaping status: Never Used  Substance Use Topics   Alcohol use: No   Drug use: No    No current facility-administered medications for this encounter.  Current Outpatient Medications:    benzonatate (TESSALON) 200 MG capsule, Take 1 capsule (200 mg total) by mouth 3 (three) times daily as needed for cough., Disp: 30 capsule, Rfl: 0   doxycycline (VIBRAMYCIN) 100 MG capsule, Take 1 capsule (100 mg total) by mouth 2 (two) times daily for 10 days., Disp: 20 capsule, Rfl: 0   fluconazole (DIFLUCAN) 100 MG tablet, Take 100 mg by mouth daily.,  Disp: , Rfl:    acidophilus (RISAQUAD) CAPS capsule, Take 1 capsule by mouth at bedtime., Disp: , Rfl:    ALPRAZolam (XANAX) 0.25 MG tablet, Take 0.25 mg by mouth daily as needed for anxiety., Disp: , Rfl:    calcium carbonate (TUMS) 500 MG chewable tablet, Chew 1 tablet (200 mg of elemental calcium total) by mouth daily with supper., Disp: 90 tablet, Rfl: 1   ezetimibe (ZETIA) 10 MG tablet, Take 10 mg by mouth daily., Disp: , Rfl:    levothyroxine (SYNTHROID) 100 MCG tablet, Take 1 tablet (100 mcg total) by mouth daily before breakfast., Disp: 90 tablet, Rfl: 1   meclizine (ANTIVERT) 25 MG tablet, Take 25 mg by mouth 3 (three) times daily as needed for dizziness., Disp: , Rfl:    Omega 3-6-9 Fatty Acids (TRIPLE OMEGA COMPLEX PO), Take 1 capsule by mouth daily., Disp: , Rfl:    omeprazole (PRILOSEC) 20 MG capsule, Take 20 mg by mouth daily., Disp: , Rfl:    VITAMIN D PO, Take 2,000 Units by mouth daily., Disp: , Rfl:    XDEMVY 0.25 % SOLN, Place 1 drop into both eyes in the morning and at bedtime., Disp: , Rfl:   Allergies  Allergen Reactions   Azithromycin Other (See Comments)    Makes her groggy and can't walk   Codeine Palpitations     ROS  As noted in HPI.   Physical Exam  BP 113/78 (BP Location: Left Arm)   Pulse 100   Temp 98.4 F (36.9 C) (Oral)   Resp 14   Ht 5\' 3"  (1.6 m)   Wt 76.7 kg   LMP 11/18/2014   SpO2 95%   BMI 29.95 kg/m   Constitutional: Well developed, well nourished, no acute distress Eyes:  EOMI, conjunctiva normal bilaterally HENT: Normocephalic, atraumatic,mucus membranes moist.  Right ear: No pain with traction pinna, palpation of mastoid or tragus.  Serous fluid behind TM.  TM normal color not dull or bulging.  EAC normal.  Left TM normal.  Hearing intact and equal bilaterally.  Purulent nasal drainage.  Normal turbinates.  Positive maxillary sinus tenderness.  No frontal sinus tenderness.  Positive postnasal drip. Respiratory: Normal inspiratory  effort lungs clear bilaterally, good air movement.  No anterior, lateral chest wall tenderness Cardiovascular: Borderline tachycardia, no murmurs rubs or gallops GI: nondistended skin: No rash, skin intact Musculoskeletal: no deformities Neurologic: Alert & oriented x 3,  no focal neuro deficits Psychiatric: Speech and behavior appropriate   ED Course   Medications - No data to display  No orders of the defined types were placed in this encounter.   No results found for this or any previous visit (from the past 24 hour(s)). No results found.  ED Clinical Impression  1. Acute non-recurrent maxillary sinusitis   2. Non-recurrent acute serous otitis media of right ear      ED Assessment/Plan    Outside records reviewed.  As noted in HPI  Patient presents with a sinusitis and a right sided serous otitis media.  She has been on amoxicillin for 2 days and it has not made a difference.  Will switch to doxycycline for 10 days.  Mucinex D, discontinue other cold medications continue Flonase, start saline nasal irrigation.  Ibuprofen combined with Tylenol 3 times a day, patient requests prescription for Tessalon Perles.  Follow-up with PCP as needed.  Discussed  MDM, treatment plan, and plan for follow-up with patient.patient agrees with plan.   Meds ordered this encounter  Medications   doxycycline (VIBRAMYCIN) 100 MG capsule    Sig: Take 1 capsule (100 mg total) by mouth 2 (two) times daily for 10 days.    Dispense:  20 capsule    Refill:  0   benzonatate (TESSALON) 200 MG capsule    Sig: Take 1 capsule (200 mg total) by mouth 3 (three) times daily as needed for cough.    Dispense:  30 capsule    Refill:  0      *This clinic note was created using Scientist, clinical (histocompatibility and immunogenetics). Therefore, there may be occasional mistakes despite careful proofreading.  ?    Domenick Gong, MD 08/22/23 [phone]

## 2023-08-22 NOTE — ED Triage Notes (Signed)
Patient reports sinus pain and pressure for a week.  Patient states that she has had a cough ongoing for 3-4 days.  Patient states that she did a teledoc visit on Wed and has been taking an antibiotic but does not seem to be helping.

## 2023-08-22 NOTE — Discharge Instructions (Signed)
Stop the amoxicillin.  Start doxycycline.  Mucinex D, discontinue other cold medications.  Continue Flonase, start saline nasal irrigation with a Lloyd Huger Med rinse and distilled water as often as you want.  600 mg of ibuprofen combined with 1000 mg of Tylenol 3 times a day, Tessalon Perles for cough.  Follow-up with PCP as needed.

## 2023-12-12 DIAGNOSIS — Z1283 Encounter for screening for malignant neoplasm of skin: Secondary | ICD-10-CM | POA: Diagnosis not present

## 2023-12-12 DIAGNOSIS — Z85820 Personal history of malignant melanoma of skin: Secondary | ICD-10-CM | POA: Diagnosis not present

## 2023-12-12 DIAGNOSIS — Z08 Encounter for follow-up examination after completed treatment for malignant neoplasm: Secondary | ICD-10-CM | POA: Diagnosis not present

## 2023-12-12 DIAGNOSIS — Z85828 Personal history of other malignant neoplasm of skin: Secondary | ICD-10-CM | POA: Diagnosis not present

## 2023-12-16 DIAGNOSIS — C4372 Malignant melanoma of left lower limb, including hip: Secondary | ICD-10-CM | POA: Diagnosis not present

## 2024-01-13 DIAGNOSIS — J0100 Acute maxillary sinusitis, unspecified: Secondary | ICD-10-CM | POA: Diagnosis not present

## 2024-01-13 DIAGNOSIS — J45909 Unspecified asthma, uncomplicated: Secondary | ICD-10-CM | POA: Diagnosis not present

## 2024-01-13 DIAGNOSIS — E039 Hypothyroidism, unspecified: Secondary | ICD-10-CM | POA: Diagnosis not present

## 2024-01-13 DIAGNOSIS — R7309 Other abnormal glucose: Secondary | ICD-10-CM | POA: Diagnosis not present

## 2024-01-13 DIAGNOSIS — E663 Overweight: Secondary | ICD-10-CM | POA: Diagnosis not present

## 2024-01-13 DIAGNOSIS — Z6827 Body mass index (BMI) 27.0-27.9, adult: Secondary | ICD-10-CM | POA: Diagnosis not present

## 2024-01-23 ENCOUNTER — Ambulatory Visit (HOSPITAL_COMMUNITY)
Admission: RE | Admit: 2024-01-23 | Discharge: 2024-01-23 | Disposition: A | Discharge status: Home / Self Care | Payer: BC Managed Care – PPO | Source: Ambulatory Visit | Attending: "Endocrinology | Admitting: "Endocrinology

## 2024-01-23 DIAGNOSIS — E890 Postprocedural hypothyroidism: Secondary | ICD-10-CM | POA: Insufficient documentation

## 2024-01-23 DIAGNOSIS — Z9089 Acquired absence of other organs: Secondary | ICD-10-CM | POA: Diagnosis not present

## 2024-01-30 DIAGNOSIS — E785 Hyperlipidemia, unspecified: Secondary | ICD-10-CM | POA: Diagnosis not present

## 2024-01-30 DIAGNOSIS — E890 Postprocedural hypothyroidism: Secondary | ICD-10-CM | POA: Diagnosis not present

## 2024-01-31 LAB — TSH: TSH: 2.22 u[IU]/mL (ref 0.450–4.500)

## 2024-01-31 LAB — T4, FREE: Free T4: 1.58 ng/dL (ref 0.82–1.77)

## 2024-01-31 LAB — LIPID PANEL
Chol/HDL Ratio: 5.9 ratio — ABNORMAL HIGH (ref 0.0–4.4)
Cholesterol, Total: 285 mg/dL — ABNORMAL HIGH (ref 100–199)
HDL: 48 mg/dL (ref >39)
LDL Chol Calc (NIH): 194 mg/dL — ABNORMAL HIGH (ref 0–99)
Triglycerides: 223 mg/dL — ABNORMAL HIGH (ref 0–149)
VLDL Cholesterol Cal: 43 mg/dL — ABNORMAL HIGH (ref 5–40)

## 2024-02-05 DIAGNOSIS — J329 Chronic sinusitis, unspecified: Secondary | ICD-10-CM | POA: Diagnosis not present

## 2024-02-05 DIAGNOSIS — J45909 Unspecified asthma, uncomplicated: Secondary | ICD-10-CM | POA: Diagnosis not present

## 2024-02-06 ENCOUNTER — Encounter: Payer: Self-pay | Admitting: "Endocrinology

## 2024-02-06 ENCOUNTER — Ambulatory Visit: Payer: BC Managed Care – PPO | Admitting: "Endocrinology

## 2024-02-06 VITALS — BP 90/60 | HR 72 | Ht 63.0 in | Wt 168.6 lb

## 2024-02-06 DIAGNOSIS — E890 Postprocedural hypothyroidism: Secondary | ICD-10-CM | POA: Diagnosis not present

## 2024-02-06 DIAGNOSIS — E782 Mixed hyperlipidemia: Secondary | ICD-10-CM | POA: Insufficient documentation

## 2024-02-06 MED ORDER — ROSUVASTATIN CALCIUM 5 MG PO TABS: 5.0000 mg | ORAL_TABLET | Freq: Every evening | ORAL | 1 refills | Status: DC

## 2024-02-06 NOTE — Progress Notes (Signed)
 Endocrinology follow-up note  02/06/24     Subjective    Past Medical History:  Diagnosis Date   Acid reflux 11/24/2014   Anxiety    Contraceptive management 11/22/2013   Dyslipidemia 04/01/2017   Elevated cholesterol    GERD (gastroesophageal reflux disease)    Hypertension    Melanoma (HCC)    t2a   Vertigo     Past Surgical History:  Procedure Laterality Date   BIOPSY THYROID   09/2022   COLONOSCOPY WITH PROPOFOL  N/A 12/24/2021   Procedure: COLONOSCOPY WITH PROPOFOL ;  Surgeon: Marnee Sink, MD;  Location: [facility];  Service: Endoscopy;  Laterality: N/A;   excision of melanoma     POLYPECTOMY  12/24/2021   Procedure: POLYPECTOMY;  Surgeon: Marnee Sink, MD;  Location: [facility];  Service: Endoscopy;;   THYROIDECTOMY N/A 11/22/2022   Procedure: TOTAL THYROIDECTOMY;  Surgeon: Oralee Billow, MD;  Location: [facility];  Service: General;  Laterality: N/A;  1 HR 45 MIN    Social History   Socioeconomic History   Marital status: Married    Spouse name: Not on file   Number of children: Not on file   Years of education: Not on file   Highest education level: Not on file  Occupational History   Not on file  Tobacco Use   Smoking status: Never   Smokeless tobacco: Never  Vaping Use   Vaping status: Never Used  Substance and Sexual Activity   Alcohol use: No   Drug use: No   Sexual activity: Yes    Birth control/protection: None, Post-menopausal  Other Topics Concern   Not on file  Social History Narrative   Not on file   Social Drivers of Health   Financial Resource Strain: Low Risk  (12/08/2020)   Overall Financial Resource Strain (CARDIA)    Difficulty of Paying Living Expenses: Not hard at all  Food Insecurity: No Food Insecurity (11/22/2022)   Hunger Vital Sign    Worried About Running Out of Food in the Last Year: Never true    Ran Out of Food in the Last Year: Never true  Transportation Needs: No Transportation Needs (11/22/2022)    PRAPARE - Transportation    Lack of Transportation (Medical): No    Lack of Transportation (Non-Medical): No  Physical Activity: Sufficiently Active (12/08/2020)   Exercise Vital Sign    Days of Exercise per Week: 7 days    Minutes of Exercise per Session: 70 min  Stress: No Stress Concern Present (12/08/2020)   [employer] of Occupational Health - Occupational Stress Questionnaire    Feeling of Stress : Not at all  Social Connections: Moderately Integrated (12/08/2020)   Social Connection and Isolation Panel [NHANES]    Frequency of Communication with Friends and Family: More than three times a week    Frequency of Social Gatherings with Friends and Family: More than three times a week    Attends Religious Services: 1 to 4 times per year    Active Member of [employer] or Organizations: No    Attends Banker Meetings: Never    Marital Status: Married  Catering manager Violence: Not At Risk (11/22/2022)   Humiliation, Afraid, Rape, and Kick questionnaire    Fear of Current or Ex-Partner: No    Emotionally Abused: No    Physically Abused: No    Sexually Abused: No    Current Outpatient Medications on File Prior to Visit  Medication Sig Dispense Refill   acidophilus (RISAQUAD)  CAPS capsule Take 1 capsule by mouth at bedtime.     ALPRAZolam  (XANAX ) 0.25 MG tablet Take 0.25 mg by mouth daily as needed for anxiety.     benzonatate  (TESSALON ) 200 MG capsule Take 1 capsule (200 mg total) by mouth 3 (three) times daily as needed for cough. 30 capsule 0   calcium  carbonate (TUMS) 500 MG chewable tablet Chew 1 tablet (200 mg of elemental calcium  total) by mouth daily with supper. 90 tablet 1   ezetimibe (ZETIA) 10 MG tablet Take 10 mg by mouth daily.     fluconazole  (DIFLUCAN ) 100 MG tablet Take 100 mg by mouth daily.     levothyroxine  (SYNTHROID ) 100 MCG tablet Take 1 tablet (100 mcg total) by mouth daily before breakfast. 90 tablet 1   meclizine  (ANTIVERT ) 25 MG tablet Take 25 mg  by mouth 3 (three) times daily as needed for dizziness.     Omega 3-6-9 Fatty Acids (TRIPLE OMEGA COMPLEX PO) Take 1 capsule by mouth daily.     omeprazole (PRILOSEC) 20 MG capsule Take 20 mg by mouth daily.     VITAMIN D PO Take 2,000 Units by mouth daily.     XDEMVY  0.25 % SOLN Place 1 drop into both eyes in the morning and at bedtime.     No current facility-administered medications on file prior to visit.      HPI   Karen Mays is a 63 y.o.-year-old female, referred by her PCP, Dr.Fusco, for evaluation for multinodular goiter.    During her prior visits, she was sent for a fine-needle aspiration  biopsy of 4.7 cm nodule in the left lobe of her thyroid .  Biopsy results were inconclusive and required ThyroSeq molecular assessment which revealed 70-80% risk of malignancy. Based on this assessments, she was sent for surgery which she underwent on November 22, 2022.  Her surgical pathology findings are negative for malignancy.  See below. -She has recovered from her surgery.  She is currently on levothyroxine  100 mcg p.o. daily before breakfast.  She presents with thyroid  function test consistent with appropriate replacement.   She does have sister with hypothyroidism and a paternal aunt who had thyroid  cancer.  No h/o radiation tx to head or neck. Pt also has a history of melanoma.  She denies dysphagia, shortness of breath, voice change. - Her labs show severe dyslipidemia.  She is on Zetia 10 mg p.o. daily.  She reports prior intolerance to statins.  Review of systems     ---------------------------------------------------------------------------------------------------------------------- Objective    BP 90/60   Pulse 72   Ht 5\' 3"  (1.6 m)   Wt 168 lb 9.6 oz (76.5 kg)   LMP 11/18/2014   BMI 29.87 kg/m    BP Readings from Last 3 Encounters:  02/06/24 90/60  08/22/23 113/78  07/25/23 100/70    Wt Readings from Last 3 Encounters:  02/06/24 168 lb 9.6 oz (76.5 kg)  08/22/23  169 lb 1.5 oz (76.7 kg)  07/25/23 169 lb (76.7 kg)     Physical Exam- Limited  Constitutional:  Body mass index is 29.87 kg/m. , not in acute distress, normal state of mind, got tearful when discussing possibility of thyroid  cancer Eyes:  EOMI, no exophthalmos Neck: Supple Thyroid : + Healing thyroidectomy scar on anterior lower neck      Molecular pathology on September 27, 2022 Intermediate-high probability of cancer or NIFTP   Fine-needle aspiration on September 27, 2022 DIAGNOSIS:  THYROID  GLAND, "LEFT MID NODULE"; ULTRASOUND-GUIDED FINE-NEEDLE  ASPIRATION (  THINPREP AND SMEAR PREPARATIONS):  - FOLLICULAR CELLS WITH HURTHLE CELL CHANGES AND SCANT COLLOID MATERIAL,  CONSISTENT WITH ATYPIA OF UNDETERMINED SIGNIFICANCE (BETHESDA CATEGORY III).    Total thyroidectomy on November 22, 2022  FINAL MICROSCOPIC DIAGNOSIS:   A. THYROID , TOTAL THYROIDECTOMY:  Multinodular goiter with dominant adenomatous nodule  Negative for malignancy  ---------------------------------------------------------------------------------------------------------------------- Recent Results (from the past 2160 hours)  TSH     Status: None   Collection Time: 01/30/24  9:38 AM  Result Value Ref Range   TSH 2.220 0.450 - 4.500 uIU/mL  T4, free     Status: None   Collection Time: 01/30/24  9:38 AM  Result Value Ref Range   Free T4 1.58 0.82 - 1.77 ng/dL  Lipid panel     Status: Abnormal   Collection Time: 01/30/24  9:38 AM  Result Value Ref Range   Cholesterol, Total 285 (H) 100 - 199 mg/dL   Triglycerides 478 (H) 0 - 149 mg/dL   HDL 48 >29 mg/dL   VLDL Cholesterol Cal 43 (H) 5 - 40 mg/dL   LDL Chol Calc (NIH) 562 (H) 0 - 99 mg/dL   LDL CALC COMMENT: Comment     Comment: Consider evaluating for Familial Hypercholesterolemia(FH), if clinically indicated.    Chol/HDL Ratio 5.9 (H) 0.0 - 4.4 ratio    Comment:                                   T. Chol/HDL Ratio                                              Men  Women                               1/2 Avg.Risk  3.4    3.3                                   Avg.Risk  5.0    4.4                                2X Avg.Risk  9.6    7.1                                3X Avg.Risk 23.4   11.0     ASSESSMENT / PLAN:  1.  Postsurgical hypothyroidism  2.  Dyslipidemia After her presurgical workup was significant for high probability of thyroid  cancer, she was sent for thyroidectomy.  However, her surgical sample did not reveal malignancy.  This is sufficient treatment, will not need radioactive iodine ablation at this time.   Based on her most recent thyroid  function test, her current dose of levothyroxine  is appropriate.  She is advised to continue levothyroxine  100 mcg p.o. daily before breakfast.    - We discussed about the correct intake of her thyroid  hormone, on empty stomach at fasting, with water , separated by at least 30 minutes from breakfast and other medications,  and separated by more than 4 hours from calcium , iron, multivitamins, acid  reflux medications (PPIs). -Patient is made aware of the fact that thyroid  hormone replacement is needed for life, dose to be adjusted by periodic monitoring of thyroid  function tests.    -She did have transient dip in her calcium , will benefit from low-dose calcium  supplement.  -She will be considered for surveillance thyroid /neck ultrasound before her next visit. Regarding her severe dyslipidemia: I had a long discussion with her about correlation between dyslipidemia and cardiovascular disease.  She would like to try low-dose Crestor again.  I discussed and prescribed Crestor 5 mg p.o. nightly along with her Zetia 10 mg p.o. daily.  Whole food plant-based diet was also discussed in detail with her.  I advised her to continue calcium  carbonate 500 mg p.o. daily at supper.  Follow Up Plan: Return in about 6 months (around 08/08/2024) for Fasting Labs  in AM B4 8.   She is advised to maintain close  follow-up with her PCP.  I spent  26  minutes in the care of the patient today including review of labs from Thyroid  Function, CMP, and other relevant labs ; imaging/biopsy records (current and previous including abstractions from other facilities); face-to-face time discussing  her lab results and symptoms, medications doses, her options of short and long term treatment based on the latest standards of care / guidelines;   and documenting the encounter.  Jerri Morale  participated in the discussions, expressed understanding, and voiced agreement with the above plans.  All questions were answered to her satisfaction. she is encouraged to contact clinic should she have any questions or concerns prior to her return visit.    Kalvin Orf , MD [facility] [address] [place], [place] [address] Phone: [phone] Fax: [phone]

## 2024-02-06 NOTE — Patient Instructions (Signed)
 Karen Mays

## 2024-03-04 ENCOUNTER — Telehealth: Payer: Self-pay

## 2024-03-04 NOTE — Telephone Encounter (Signed)
 Pt called stating at her last visit you discussed Lifestyle Medicine with her. She would like to know if she incorporates chicken within her diet do you recommend broiled or grilled, one over the other.

## 2024-03-04 NOTE — Telephone Encounter (Signed)
 Spoke with pt advising her both broiled and grilled chicken are acceptable but broiling is healthier per Dr.Nida. Pt voiced understanding.

## 2024-06-11 DIAGNOSIS — Z08 Encounter for follow-up examination after completed treatment for malignant neoplasm: Secondary | ICD-10-CM | POA: Diagnosis not present

## 2024-06-11 DIAGNOSIS — Z1283 Encounter for screening for malignant neoplasm of skin: Secondary | ICD-10-CM | POA: Diagnosis not present

## 2024-06-11 DIAGNOSIS — D225 Melanocytic nevi of trunk: Secondary | ICD-10-CM | POA: Diagnosis not present

## 2024-06-11 DIAGNOSIS — Z85820 Personal history of malignant melanoma of skin: Secondary | ICD-10-CM | POA: Diagnosis not present

## 2024-07-21 ENCOUNTER — Other Ambulatory Visit (HOSPITAL_COMMUNITY)
Admission: RE | Admit: 2024-07-21 | Discharge: 2024-07-21 | Disposition: A | Source: Ambulatory Visit | Attending: Adult Health | Admitting: Adult Health

## 2024-07-21 ENCOUNTER — Ambulatory Visit: Admitting: Adult Health

## 2024-07-21 ENCOUNTER — Encounter: Payer: Self-pay | Admitting: Adult Health

## 2024-07-21 VITALS — BP 108/69 | HR 76 | Ht 63.0 in | Wt 164.0 lb

## 2024-07-21 DIAGNOSIS — Z124 Encounter for screening for malignant neoplasm of cervix: Secondary | ICD-10-CM | POA: Diagnosis not present

## 2024-07-21 DIAGNOSIS — N9489 Other specified conditions associated with female genital organs and menstrual cycle: Secondary | ICD-10-CM

## 2024-07-21 MED ORDER — NYSTATIN-TRIAMCINOLONE 100000-0.1 UNIT/GM-% EX CREA: 1.0000 | TOPICAL_CREAM | Freq: Two times a day (BID) | CUTANEOUS | 0 refills | Status: AC

## 2024-07-21 NOTE — Progress Notes (Signed)
  Subjective:     Patient ID: Karen Mays, female   DOB: October 10, 1960, 62 y.o.   MRN: [id number]  HPI Karen Mays is a 63 year old white female, married, PM in complaining of burning in vaginal area, more on outside, when sitting, but thought she had yeast infection last week and used OTC monistat with some relief. She needs a pap too.  PCP is Dr Marvine   Review of Systems +burning in vaginal area, more on outside, when sitting, but thought she had yeast infection last week and used OTC monistat with some relief Denies any vaginal bleeding Reviewed past medical,surgical, social and family history. Reviewed medications and allergies.      Objective:   Physical Exam BP 108/69 (BP Location: Left Arm, Patient Position: Sitting, Cuff Size: Normal)   Pulse 76   Ht 5' 3 (1.6 m)   Wt 164 lb (74.4 kg)   LMP 11/18/2014   BMI 29.05 kg/m     Skin warm and dry.Pelvic: external genitalia is normal in appearance no lesions, vagina: pale,urethra has no lesions or masses noted, cervix:smooth, pap with HR HPV genotyping performed, uterus: normal size, shape and contour, non tender, no masses felt, adnexa: no masses or tenderness noted. Bladder is non tender and no masses felt.  Fall risk is low  Upstream - 07/21/24 1010       Pregnancy Intention Screening   Does the patient want to become pregnant in the next year? N/A    Does the patient's partner want to become pregnant in the next year? N/A    Would the patient like to discuss contraceptive options today? N/A      Contraception Wrap Up   Current Method No Method - Other Reason   PM   End Method No Method - Other Reason   PM   Contraception Counseling Provided No         Examination chaperoned by Clarita Salt LPN  Assessment:     1. Routine Papanicolaou smear Pap sent Pap in 3 years if negative Physical with PCP  - Cytology - PAP( [place])  2. Vulvar burning (Primary) +burning in vaginal area, more outside was better after  monistat Will rx mycolog for external tissue  Meds ordered this encounter  Medications   nystatin-triamcinolone (MYCOLOG II) cream    Sig: Apply 1 Application topically 2 (two) times daily. As needed    Dispense:  30 g    Refill:  0    Supervising Provider:   JAYNE VONN DEL [[id number]]       Plan:     Pap in 3 years if negative today

## 2024-07-23 ENCOUNTER — Ambulatory Visit: Payer: Self-pay | Admitting: Adult Health

## 2024-07-23 ENCOUNTER — Other Ambulatory Visit: Payer: Self-pay | Admitting: "Endocrinology

## 2024-07-23 LAB — CYTOLOGY - PAP
Comment: NEGATIVE
Diagnosis: NEGATIVE
High risk HPV: NEGATIVE

## 2024-08-04 ENCOUNTER — Other Ambulatory Visit: Payer: Self-pay | Admitting: "Endocrinology

## 2024-08-07 LAB — TSH: TSH: 3.52 u[IU]/mL (ref 0.450–4.500)

## 2024-08-07 LAB — LIPID PANEL
Chol/HDL Ratio: 3.8 ratio (ref 0.0–4.4)
Cholesterol, Total: 187 mg/dL (ref 100–199)
HDL: 49 mg/dL (ref >39)
LDL Chol Calc (NIH): 109 mg/dL — ABNORMAL HIGH (ref 0–99)
Triglycerides: 163 mg/dL — ABNORMAL HIGH (ref 0–149)
VLDL Cholesterol Cal: 29 mg/dL (ref 5–40)

## 2024-08-07 LAB — T4, FREE: Free T4: 1.56 ng/dL (ref 0.82–1.77)

## 2024-08-13 ENCOUNTER — Encounter: Payer: Self-pay | Admitting: "Endocrinology

## 2024-08-13 ENCOUNTER — Ambulatory Visit: Admitting: "Endocrinology

## 2024-08-13 VITALS — BP 94/56 | HR 80 | Ht 63.0 in | Wt 163.8 lb

## 2024-08-13 DIAGNOSIS — E782 Mixed hyperlipidemia: Secondary | ICD-10-CM

## 2024-08-13 DIAGNOSIS — E890 Postprocedural hypothyroidism: Secondary | ICD-10-CM | POA: Diagnosis not present

## 2024-08-13 MED ORDER — LEVOTHYROXINE SODIUM 100 MCG PO TABS: 100.0000 ug | ORAL_TABLET | Freq: Every day | ORAL | 1 refills | Status: AC

## 2024-08-13 NOTE — Progress Notes (Signed)
 Endocrinology follow-up note  08/13/24     Subjective    Past Medical History:  Diagnosis Date   Acid reflux 11/24/2014   Anxiety    Contraceptive management 11/22/2013   Dyslipidemia 04/01/2017   Elevated cholesterol    GERD (gastroesophageal reflux disease)    Hypertension    Melanoma (HCC)    t2a   Vertigo     Past Surgical History:  Procedure Laterality Date   BIOPSY THYROID   09/2022   COLONOSCOPY WITH PROPOFOL  N/A 12/24/2021   Procedure: COLONOSCOPY WITH PROPOFOL ;  Surgeon: Jinny Carmine, MD;  Location: [facility];  Service: Endoscopy;  Laterality: N/A;   excision of melanoma     POLYPECTOMY  12/24/2021   Procedure: POLYPECTOMY;  Surgeon: Jinny Carmine, MD;  Location: [facility];  Service: Endoscopy;;   THYROIDECTOMY N/A 11/22/2022   Procedure: TOTAL THYROIDECTOMY;  Surgeon: Eletha Boas, MD;  Location: [facility];  Service: General;  Laterality: N/A;  1 HR 45 MIN    Social History   Socioeconomic History   Marital status: Married    Spouse name: Not on file   Number of children: Not on file   Years of education: Not on file   Highest education level: Not on file  Occupational History   Not on file  Tobacco Use   Smoking status: Never   Smokeless tobacco: Never  Vaping Use   Vaping status: Never Used  Substance and Sexual Activity   Alcohol use: No   Drug use: No   Sexual activity: Yes    Birth control/protection: Post-menopausal  Other Topics Concern   Not on file  Social History Narrative   Not on file   Social Drivers of Health   Financial Resource Strain: Low Risk  (12/08/2020)   Overall Financial Resource Strain (CARDIA)    Difficulty of Paying Living Expenses: Not hard at all  Food Insecurity: No Food Insecurity (11/22/2022)   Hunger Vital Sign    Worried About Running Out of Food in the Last Year: Never true    Ran Out of Food in the Last Year: Never true  Transportation Needs: No Transportation Needs (11/22/2022)   PRAPARE -  Transportation    Lack of Transportation (Medical): No    Lack of Transportation (Non-Medical): No  Physical Activity: Sufficiently Active (12/08/2020)   Exercise Vital Sign    Days of Exercise per Week: 7 days    Minutes of Exercise per Session: 70 min  Stress: No Stress Concern Present (12/08/2020)   [employer] of Occupational Health - Occupational Stress Questionnaire    Feeling of Stress : Not at all  Social Connections: Moderately Integrated (12/08/2020)   Social Connection and Isolation Panel    Frequency of Communication with Friends and Family: More than three times a week    Frequency of Social Gatherings with Friends and Family: More than three times a week    Attends Religious Services: 1 to 4 times per year    Active Member of [employer] or Organizations: No    Attends Banker Meetings: Never    Marital Status: Married  Catering Manager Violence: Not At Risk (11/22/2022)   Humiliation, Afraid, Rape, and Kick questionnaire    Fear of Current or Ex-Partner: No    Emotionally Abused: No    Physically Abused: No    Sexually Abused: No    Current Outpatient Medications on File Prior to Visit  Medication Sig Dispense Refill   calcium  carbonate (TUMS) 500  MG chewable tablet Chew 1 tablet (200 mg of elemental calcium  total) by mouth daily with supper. (Patient taking differently: Chew 2 tablets by mouth daily with supper.) 90 tablet 1   ezetimibe (ZETIA) 10 MG tablet Take 10 mg by mouth daily.     meclizine  (ANTIVERT ) 25 MG tablet Take 25 mg by mouth 3 (three) times daily as needed for dizziness.     nystatin-triamcinolone (MYCOLOG II) cream Apply 1 Application topically 2 (two) times daily. As needed 30 g 0   Omega 3-6-9 Fatty Acids (TRIPLE OMEGA COMPLEX PO) Take 1 capsule by mouth daily.     omeprazole (PRILOSEC OTC) 20 MG tablet Take 20 mg by mouth daily.     rosuvastatin  (CRESTOR ) 5 MG tablet TAKE ONE TABLET (5MG  TOTAL) BY MOUTH AT BEDTIME 90 tablet 1   VITAMIN D  PO Take 2,000 Units by mouth daily.     No current facility-administered medications on file prior to visit.      HPI   Karen Mays is a 63 y.o.-year-old female, referred by her PCP, Dr.Fusco, for evaluation for multinodular goiter.    During her prior visits, she was sent for a fine-needle aspiration  biopsy of 4.7 cm nodule in the left lobe of her thyroid .  Biopsy results were inconclusive and required ThyroSeq molecular assessment which revealed 70-80% risk of malignancy. Based on this assessments, she was sent for surgery which she underwent on November 22, 2022.  Her surgical pathology findings are negative for malignancy.  See below. -She has recovered from her surgery.  She did not require any ablative, adjuvant RAI therapy.  Her previsit thyroid /neck ultrasound is negative.  See below.  She is currently on levothyroxine  100 mcg p.o. daily before breakfast.  Her previsit thyroid  function tests are consistent with appropriate replacement.   She does have sister with hypothyroidism and a paternal aunt who had thyroid  cancer.  No h/o radiation tx to head or neck. Pt also has a history of melanoma.  She denies dysphagia, shortness of breath, voice change. - Her labs show severe dyslipidemia.  She was given a prescription for Crestor  5 mg p.o. nightly along with her Zetia to manage dyslipidemia.  She returns with significant improvement in her lipid panel.   C  She reports prior intolerance to statins.  Review of systems   ---------------------------------------------------------------------------------------------------------------------- Objective    BP (!) 94/56   Pulse 80   Ht 5' 3 (1.6 m)   Wt 163 lb 12.8 oz (74.3 kg)   LMP 11/18/2014   BMI 29.02 kg/m    BP Readings from Last 3 Encounters:  08/13/24 (!) 94/56  07/21/24 108/69  02/06/24 90/60    Wt Readings from Last 3 Encounters:  08/13/24 163 lb 12.8 oz (74.3 kg)  07/21/24 164 lb (74.4 kg)  02/06/24 168 lb 9.6 oz  (76.5 kg)     Physical Exam- Limited  Constitutional:  Body mass index is 29.02 kg/m. , not in acute distress, normal state of mind, got tearful when discussing possibility of thyroid  cancer Eyes:  EOMI, no exophthalmos Neck: Supple Thyroid : + Healing thyroidectomy scar on anterior lower neck      Molecular pathology on September 27, 2022 Intermediate-high probability of cancer or NIFTP   Fine-needle aspiration on September 27, 2022 DIAGNOSIS:  THYROID  GLAND, LEFT MID NODULE; ULTRASOUND-GUIDED FINE-NEEDLE  ASPIRATION (THINPREP AND SMEAR PREPARATIONS):  - FOLLICULAR CELLS WITH HURTHLE CELL CHANGES AND SCANT COLLOID MATERIAL,  CONSISTENT WITH ATYPIA OF UNDETERMINED SIGNIFICANCE (BETHESDA CATEGORY  III).    Total thyroidectomy on November 22, 2022  FINAL MICROSCOPIC DIAGNOSIS:   A. THYROID , TOTAL THYROIDECTOMY:  Multinodular goiter with dominant adenomatous nodule  Negative for malignancy    ---------------------------------------------------------------------------------------------------------------------- Recent Results (from the past 2160 hours)  Cytology - PAP( [place])     Status: None   Collection Time: 07/21/24 10:15 AM  Result Value Ref Range   High risk HPV Negative    Adequacy      Satisfactory for evaluation. The presence or absence of an   Adequacy      endocervical/transformation zone component cannot be determined because   Adequacy of atrophy.    Diagnosis      - Negative for intraepithelial lesion or malignancy (NILM)   Comment Atrophic changes are present.    Comment Normal Reference Range HPV - Negative   Lipid panel     Status: Abnormal   Collection Time: 08/06/24 10:09 AM  Result Value Ref Range   Cholesterol, Total 187 100 - 199 mg/dL   Triglycerides 836 (H) 0 - 149 mg/dL   HDL 49 >60 mg/dL   VLDL Cholesterol Cal 29 5 - 40 mg/dL   LDL Chol Calc (NIH) 890 (H) 0 - 99 mg/dL   Chol/HDL Ratio 3.8 0.0 - 4.4 ratio    Comment:                                    T. Chol/HDL Ratio                                             Men  Women                               1/2 Avg.Risk  3.4    3.3                                   Avg.Risk  5.0    4.4                                2X Avg.Risk  9.6    7.1                                3X Avg.Risk 23.4   11.0   TSH     Status: None   Collection Time: 08/06/24 10:09 AM  Result Value Ref Range   TSH 3.520 0.450 - 4.500 uIU/mL  T4, free     Status: None   Collection Time: 08/06/24 10:09 AM  Result Value Ref Range   Free T4 1.56 0.82 - 1.77 ng/dL   Thyroid /neck ultrasound on January 23, 2024 FINDINGS: No residual thyroid  tissue is identified in the thyroidectomy bed.   No enlarged lymph nodes seen in the imaged portions of the neck.   IMPRESSION: No evidence of recurrent or residual thyroid  malignancy.   The above is in keeping with the ACR TI-RADS recommendations - J Am Coll Radiol 2017;14:587-595.     ASSESSMENT / PLAN:  1.  Postsurgical hypothyroidism  2.  Dyslipidemia After her presurgical workup was significant for high probability of thyroid  cancer, she was sent for thyroidectomy.  However, her surgical sample did not reveal malignancy.  Total thyroidectomy was deemed sufficient and did not require adjuvant or ablative RAI therapy.  Her previsit thyroid  ultrasound is unremarkable.    Her current thyroid  function tests are consistent with appropriate replacement.  She is advised to continue levothyroxine  100 mcg p.o. daily before breakfast.  - We discussed about the correct intake of her thyroid  hormone, on empty stomach at fasting, with water , separated by at least 30 minutes from breakfast and other medications,  and separated by more than 4 hours from calcium , iron, multivitamins, acid reflux medications (PPIs). -Patient is made aware of the fact that thyroid  hormone replacement is needed for life, dose to be adjusted by periodic monitoring of thyroid  function tests.   -She  did have transient dip in her calcium , will benefit from low-dose calcium  supplement.  Regarding her severe dyslipidemia: She has accepted a prescription for Crestor  5 mg p.o. nightly during her last visit.  She returns with remarkable improvement in her lipid profile on total cholesterol, triglycerides as well as LDL.  She is advised to continue Crestor  5 mg every other day due to some myalgias.  She is advised to continue Zetia 10 mg p.o. daily.     Whole food plant-based diet was also discussed in detail with her.  I advised her to continue calcium  carbonate 500 mg p.o. daily at supper.  Follow Up Plan: Return in about 6 months (around 02/10/2025) for Fasting Labs  in AM B4 8.   She is advised to maintain close follow-up with her PCP.  I spent  25  minutes in the care of the patient today including review of labs from Thyroid  Function, CMP, and other relevant labs ; imaging/biopsy records (current and previous including abstractions from other facilities); face-to-face time discussing  her lab results and symptoms, medications doses, her options of short and long term treatment based on the latest standards of care / guidelines;   and documenting the encounter.  Karna Boyer  participated in the discussions, expressed understanding, and voiced agreement with the above plans.  All questions were answered to her satisfaction. she is encouraged to contact clinic should she have any questions or concerns prior to her return visit.   Ranny Earl , MD [facility] [address] [place], [place] [address] Phone: [phone] Fax: [phone]

## 2025-02-11 ENCOUNTER — Ambulatory Visit: Admitting: "Endocrinology
